# Patient Record
Sex: Male | Born: 1980 | Race: White | Hispanic: No | Marital: Married | Smoking: Current some day smoker
Health system: Southern US, Community
[De-identification: ages and names within clinical notes are randomized; demographics above are authoritative.]

## PROBLEM LIST (undated history)

## (undated) ENCOUNTER — Emergency Department (HOSPITAL_COMMUNITY): Admission: EM | Payer: BLUE CROSS/BLUE SHIELD | Source: Home / Self Care

## (undated) DIAGNOSIS — R0789 Other chest pain: Secondary | ICD-10-CM

## (undated) DIAGNOSIS — K219 Gastro-esophageal reflux disease without esophagitis: Secondary | ICD-10-CM

## (undated) HISTORY — DX: Gastro-esophageal reflux disease without esophagitis: K21.9

## (undated) HISTORY — DX: Other chest pain: R07.89

---
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
## 2010-04-03 ENCOUNTER — Ambulatory Visit: Payer: Self-pay | Admitting: Internal Medicine

## 2010-04-03 DIAGNOSIS — K219 Gastro-esophageal reflux disease without esophagitis: Secondary | ICD-10-CM | POA: Insufficient documentation

## 2010-04-03 DIAGNOSIS — Z87442 Personal history of urinary calculi: Secondary | ICD-10-CM | POA: Insufficient documentation

## 2010-04-03 DIAGNOSIS — F411 Generalized anxiety disorder: Secondary | ICD-10-CM | POA: Insufficient documentation

## 2010-07-29 NOTE — Assessment & Plan Note (Signed)
Summary: NEW PT/FLU SHOT/BCBS/#/LB   Vital Signs:  Patient profile:   30 year old male Height:      73 inches Weight:      212 pounds BMI:     28.07 O2 Sat:      97 % on Room air Temp:     98.9 degrees F oral Pulse rate:   83 / minute Pulse rhythm:   regular Resp:     16 per minute BP sitting:   120 / 82  (left arm) Cuff size:   regular  Vitals Entered By: Alysia Penna (April 03, 2010 1:54 PM)  Nutrition Counseling: Patient's BMI is greater than 25 and therefore counseled on weight management options.  O2 Flow:  Room air CC: pt here for flu vaccine. /cp sma   Primary Care Provider:  Yetta Barre  CC:  pt here for flu vaccine. /cp sma.  History of Present Illness: New to me he needs to establish with a new PCP. He feels well today and offers no complaints.  Dyspepsia History:      He has no alarm features of dyspepsia including no history of melena, hematochezia, dysphagia, persistent vomiting, or involuntary weight loss > 5%.  There is a prior history of GERD.  The patient does not have a prior history of documented ulcer disease.  The dominant symptom is heartburn or acid reflux.  An H-2 blocker medication is currently being taken.  He notes that the symptoms have improved with the H-2 blocker therapy.  Symptoms have not persisted after 4 weeks of H-2 blocker treatment.  No previous upper endoscopy has been done.    Preventive Screening-Counseling & Management  Alcohol-Tobacco     Alcohol drinks/day: <1     Alcohol type: all     >5/day in last 3 mos: no     Alcohol Counseling: not indicated; use of alcohol is not excessive or problematic     Feels need to cut down: no     Feels annoyed by complaints: no     Feels guilty re: drinking: no     Needs 'eye opener' in am: no     Smoking Status: never     Tobacco Counseling: not indicated; no tobacco use  Caffeine-Diet-Exercise     Does Patient Exercise: yes     Exercise Counseling: to improve exercise  regimen  Hep-HIV-STD-Contraception     Hepatitis Risk: no risk noted     HIV Risk: no risk noted     STD Risk: no risk noted     TSE monthly: yes     Testicular SE Education/Counseling to perform regular STE     Sun Exposure-Excessive: no  Safety-Violence-Falls     Seat Belt Use: yes     Helmet Use: n/a     Firearms in the Home: firearms in the home     Firearm Counseling: to practice firearm safety     Smoke Detectors: yes     Violence in the Home: no risk noted     Sexual Abuse: no      Sexual History:  currently monogamous.        Drug Use:  no.        Blood Transfusions:  no.    Medications Prior to Update: 1)  None  Current Medications (verified): 1)  Xanax 0.5 Mg Tabs (Alprazolam) .Marland Kitchen.. 1 Tablet Daily As Needed 2)  Zantac 75 75 Mg Tabs (Ranitidine Hcl) .... One By Mouth Two Times A Day  As Needed  Allergies (verified): No Known Drug Allergies  Past History:  Past Medical History: Anxiety- Dr. Evelene Croon GERD Nephrolithiasis, hx of  Past Surgical History: Denies surgical history  Family History: Family History Diabetes 1st degree relative Family History Hypertension  Social History: Occupation: IT for [employer] Married Never Smoked Alcohol use-yes Drug use-no Regular exercise-yes Smoking Status:  never Does Patient Exercise:  yes Hepatitis Risk:  no risk noted HIV Risk:  no risk noted STD Risk:  no risk noted Sun Exposure-Excessive:  no Seat Belt Use:  yes Sexual History:  currently monogamous Blood Transfusions:  no Drug Use:  no  Review of Systems       The patient complains of weight gain.  The patient denies anorexia, chest pain, syncope, dyspnea on exertion, peripheral edema, prolonged cough, headaches, hemoptysis, abdominal pain, hematuria, suspicious skin lesions, difficulty walking, depression, enlarged lymph nodes, angioedema, and testicular masses.   Psych:  Complains of anxiety; denies depression, easily angered, easily tearful,  irritability, mental problems, panic attacks, sense of great danger, suicidal thoughts/plans, thoughts of violence, unusual visions or sounds, and thoughts /plans of harming others.  Physical Exam  General:  alert, well-developed, well-nourished, well-hydrated, appropriate dress, normal appearance, healthy-appearing, cooperative to examination, good hygiene, and overweight-appearing.   Head:  normocephalic, atraumatic, no abnormalities observed, and no abnormalities palpated.   Eyes:  vision grossly intact, pupils equal, pupils round, and pupils reactive to light.   Mouth:  Oral mucosa and oropharynx without lesions or exudates.  Teeth in good repair. Neck:  supple, full ROM, no masses, no thyromegaly, no JVD, normal carotid upstroke, no carotid bruits, no cervical lymphadenopathy, and no neck tenderness.   Lungs:  normal respiratory effort, no intercostal retractions, no accessory muscle use, normal breath sounds, no dullness, no fremitus, no crackles, and no wheezes.   Heart:  normal rate, regular rhythm, no murmur, no gallop, no rub, and no JVD.   Abdomen:  soft, non-tender, normal bowel sounds, no distention, no masses, no guarding, no rigidity, no rebound tenderness, no abdominal hernia, no inguinal hernia, no hepatomegaly, and no splenomegaly.   Genitalia:  circumcised, no hydrocele, no varicocele, no scrotal masses, no testicular masses or atrophy, no cutaneous lesions, and no urethral discharge.   Msk:  No deformity or scoliosis noted of thoracic or lumbar spine.   Pulses:  R and L carotid,radial,femoral,dorsalis pedis and posterior tibial pulses are full and equal bilaterally Extremities:  No clubbing, cyanosis, edema, or deformity noted with normal full range of motion of all joints.   Neurologic:  No cranial nerve deficits noted. Station and gait are normal. Plantar reflexes are down-going bilaterally. DTRs are symmetrical throughout. Sensory, motor and coordinative functions appear  intact. Skin:  Intact without suspicious lesions or rashes Cervical Nodes:  no anterior cervical adenopathy and no posterior cervical adenopathy.   Axillary Nodes:  no R axillary adenopathy and no L axillary adenopathy.   Inguinal Nodes:  no R inguinal adenopathy and no L inguinal adenopathy.   Psych:  Cognition and judgment appear intact. Alert and cooperative with normal attention span and concentration. No apparent delusions, illusions, hallucinations   Impression & Recommendations:  Problem # 1:  GERD (ICD-530.81) Assessment Improved  His updated medication list for this problem includes:    Zantac 75 75 Mg Tabs (Ranitidine hcl) ..... One by mouth two times a day as needed  Complete Medication List: 1)  Xanax 0.5 Mg Tabs (Alprazolam) .Marland Kitchen.. 1 tablet daily as needed 2)  Zantac 75  75 Mg Tabs (Ranitidine hcl) .... One by mouth two times a day as needed  Other Orders: Admin 1st Vaccine (16109) Flu Vaccine 15yrs + (60454) Tdap => 33yrs IM (09811) Admin of Any Addtl Vaccine (91478)   Patient Instructions: 1)  Please schedule a follow-up appointment as needed. 2)  Avoid foods high in acid (tomatoes, citrus juices, spicy foods). Avoid eating within two hours of lying down or before exercising. Do not over eat; try smaller more frequent meals. Elevate head of bed twelve inches when sleeping. 3)  It is important that you exercise regularly at least 20 minutes 5 times a week. If you develop chest pain, have severe difficulty breathing, or feel very tired , stop exercising immediately and seek medical attention. 4)  You need to lose weight. Consider a lower calorie diet and regular exercise.  Flu Vaccine Consent Questions     Do you have a history of severe allergic reactions to this vaccine? no    Any prior history of allergic reactions to egg and/or gelatin? no    Do you have a sensitivity to the preservative Thimersol? no    Do you have a past history of Guillan-Barre Syndrome? no    Do  you currently have an acute febrile illness? no    Have you ever had a severe reaction to latex? no    Vaccine information given and explained to patient? yes    Are you currently pregnant? no    Lot Number:[id number]   Exp Date:12/27/2010   Site Given  Left Deltoid IM down or before exercising. Do not over eat; try smaller more frequent meals. Elevate head of bed twelve inches when sleeping. 3)  It is important that you exercise regularly at least 20 minutes 5 times a week. If you develop chest pain, have severe difficulty breathing, or feel very tired , stop exercising immediately and seek medical attention. 4)  You need to lose weight. Consider a lower calorie diet and regular exercise.   .lbflu   Immunizations Administered:  Tetanus Vaccine:    Vaccine Type: Tdap    Site: right deltoid    Mfr: GlaxoSmithKline    Dose: 0.5 ml    Route: IM    Given by: Rock Nephew CMA    Exp. Date: 03/19/2012    Lot #: [id number]    VIS given: 05/16/08 version given April 03, 2010.

## 2012-11-01 ENCOUNTER — Ambulatory Visit (INDEPENDENT_AMBULATORY_CARE_PROVIDER_SITE_OTHER): Payer: Self-pay | Admitting: Family Medicine

## 2012-11-01 VITALS — BP 112/70 | Temp 97.9°F | Wt 232.0 lb

## 2012-11-01 DIAGNOSIS — J399 Disease of upper respiratory tract, unspecified: Secondary | ICD-10-CM

## 2012-11-01 DIAGNOSIS — J398 Other specified diseases of upper respiratory tract: Secondary | ICD-10-CM

## 2012-11-01 MED ORDER — HYDROCODONE-HOMATROPINE 5-1.5 MG/5ML PO SYRP: 5.0000 mL | ORAL_SOLUTION | Freq: Two times a day (BID) | ORAL | Status: DC

## 2012-11-01 NOTE — Progress Notes (Signed)
Chief Complaint  Patient presents with  . Cough    congestion, sore throat, runny nose, chills, body aches x 2 days     HPI:  Acute visit for ?URI: -started: 2 days ago -symptoms:nasal congestion, sore throat, cough, chills, body aches -denies:fever, SOB, NVD, tooth pain,  Ear pain  -has tried: OTC cough meications -sick contacts: yes - son -Hx of: no history of asthma   ROS: See pertinent positives and negatives per HPI.  No past medical history on file.  No family history on file.  History   Social History  . Marital Status: Married    Spouse Name: N/A    Number of Children: N/A  . Years of Education: N/A   Social History Main Topics  . Smoking status: Not on file  . Smokeless tobacco: Not on file  . Alcohol Use: Not on file  . Drug Use: Not on file  . Sexually Active: Not on file   Other Topics Concern  . Not on file   Social History Narrative  . No narrative on file    Current outpatient prescriptions:HYDROcodone-homatropine (HYCODAN) 5-1.5 MG/5ML syrup, Take 5 mLs by mouth every 12 (twelve) hours., Disp: 120 mL, Rfl: 0  EXAM:  Filed Vitals:   11/01/12 1048  BP: 112/70  Temp: 97.9 F (36.6 C)    Body mass index is 30.62 kg/(m^2).  GENERAL: vitals reviewed and listed above, alert, oriented, appears well hydrated and in no acute distress  HEENT: atraumatic, conjunttiva clear, no obvious abnormalities on inspection of external nose and ears, normal appearance of ear canals and TMs, clear nasal congestion, mild post oropharyngeal erythema with PND, no tonsillar edema or exudate, no sinus TTP  NECK: no obvious masses on inspection  LUNGS: clear to auscultation bilaterally, no wheezes, rales or rhonchi, good air movement  CV: HRRR, no peripheral edema  MS: moves all extremities without noticeable abnormality  PSYCH: pleasant and cooperative, no obvious depression or anxiety  ASSESSMENT AND PLAN:  Discussed the following assessment and  plan:  Upper respiratory disease - Plan: HYDROcodone-homatropine (HYCODAN) 5-1.5 MG/5ML syrup  -VURI - advised supportive care and return precautions. Cough med given - risks discussed. -Patient advised to return or notify a doctor immediately if symptoms worsen or persist or new concerns arise.  Patient Instructions  INSTRUCTIONS FOR UPPER RESPIRATORY INFECTION:  -plenty of rest and fluids  -nasal saline wash 2-3 times daily (use prepackaged nasal saline or bottled/distilled water if making your own)   -can use sinex nasal spray for drainage and nasal congestion - but do NOT use longer then 3-4 days  -can use tylenol or ibuprofen as directed for aches and sorethroat  -in the winter time, using a humidifier at night is helpful (please follow cleaning instructions)  -if you are taking a cough medication - use only as directed, may also try a teaspoon of honey to coat the throat and throat lozenges  -for sore throat, salt water gargles can help  -follow up if you have fevers, facial pain, tooth pain, difficulty breathing or are worsening or not getting better as expected      KIM, Dahlia Client R.

## 2012-11-01 NOTE — Patient Instructions (Signed)
INSTRUCTIONS FOR UPPER RESPIRATORY INFECTION:  -plenty of rest and fluids  -nasal saline wash 2-3 times daily (use prepackaged nasal saline or bottled/distilled water if making your own)   -can use sinex nasal spray for drainage and nasal congestion - but do NOT use longer then 3-4 days  -can use tylenol or ibuprofen as directed for aches and sorethroat  -in the winter time, using a humidifier at night is helpful (please follow cleaning instructions)  -if you are taking a cough medication - use only as directed, may also try a teaspoon of honey to coat the throat and throat lozenges  -for sore throat, salt water gargles can help  -follow up if you have fevers, facial pain, tooth pain, difficulty breathing or are worsening or not getting better as expected

## 2012-11-14 ENCOUNTER — Encounter: Payer: Self-pay | Admitting: Internal Medicine

## 2012-12-05 ENCOUNTER — Ambulatory Visit (INDEPENDENT_AMBULATORY_CARE_PROVIDER_SITE_OTHER): Payer: BC Managed Care – PPO | Admitting: Internal Medicine

## 2012-12-05 ENCOUNTER — Encounter: Payer: Self-pay | Admitting: Internal Medicine

## 2012-12-05 ENCOUNTER — Other Ambulatory Visit (INDEPENDENT_AMBULATORY_CARE_PROVIDER_SITE_OTHER): Payer: BC Managed Care – PPO

## 2012-12-05 VITALS — BP 138/72 | HR 62 | Temp 98.0°F | Resp 16 | Wt 232.0 lb

## 2012-12-05 DIAGNOSIS — Z0000 Encounter for general adult medical examination without abnormal findings: Secondary | ICD-10-CM

## 2012-12-05 LAB — COMPREHENSIVE METABOLIC PANEL
Albumin: 4.2 g/dL (ref 3.5–5.2)
Alkaline Phosphatase: 75 U/L (ref 39–117)
BUN: 17 mg/dL (ref 6–23)
CO2: 30 mEq/L (ref 19–32)
Calcium: 9.5 mg/dL (ref 8.4–10.5)
Chloride: 106 mEq/L (ref 96–112)
GFR: 59.14 mL/min — ABNORMAL LOW (ref >60.00)
Glucose, Bld: 87 mg/dL (ref 70–99)
Potassium: 4.4 mEq/L (ref 3.5–5.1)

## 2012-12-05 LAB — CBC WITH DIFFERENTIAL/PLATELET
Basophils Absolute: 0 10*3/uL (ref 0.0–0.1)
Eosinophils Absolute: 0.1 10*3/uL (ref 0.0–0.7)
HCT: 46.7 % (ref 39.0–52.0)
Hemoglobin: 15.8 g/dL (ref 13.0–17.0)
Lymphs Abs: 2.3 10*3/uL (ref 0.7–4.0)
MCHC: 33.8 g/dL (ref 30.0–36.0)
MCV: 88.7 fl (ref 78.0–100.0)
Monocytes Absolute: 0.7 10*3/uL (ref 0.1–1.0)
Neutro Abs: 4.3 10*3/uL (ref 1.4–7.7)
Platelets: 205 10*3/uL (ref 150.0–400.0)
RDW: 13 % (ref 11.5–14.6)

## 2012-12-05 LAB — LIPID PANEL
Cholesterol: 177 mg/dL (ref 0–200)
VLDL: 126.8 mg/dL — ABNORMAL HIGH (ref 0.0–40.0)

## 2012-12-05 NOTE — Patient Instructions (Addendum)
Health Maintenance, Males A healthy lifestyle and preventative care can promote health and wellness.  Maintain regular health, dental, and eye exams.  Eat a healthy diet. Foods like vegetables, fruits, whole grains, low-fat dairy products, and lean protein foods contain the nutrients you need without too many calories. Decrease your intake of foods high in solid fats, added sugars, and salt. Get information about a proper diet from your caregiver, if necessary.  Regular physical exercise is one of the most important things you can do for your health. Most adults should get at least 150 minutes of moderate-intensity exercise (any activity that increases your heart rate and causes you to sweat) each week. In addition, most adults need muscle-strengthening exercises on 2 or more days a week.   Maintain a healthy weight. The body mass index (BMI) is a screening tool to identify possible weight problems. It provides an estimate of body fat based on height and weight. Your caregiver can help determine your BMI, and can help you achieve or maintain a healthy weight. For adults 20 years and older:  A BMI below 18.5 is considered underweight.  A BMI of 18.5 to 24.9 is normal.  A BMI of 25 to 29.9 is considered overweight.  A BMI of 30 and above is considered obese.  Maintain normal blood lipids and cholesterol by exercising and minimizing your intake of saturated fat. Eat a balanced diet with plenty of fruits and vegetables. Blood tests for lipids and cholesterol should begin at age 20 and be repeated every 5 years. If your lipid or cholesterol levels are high, you are over 50, or you are a high risk for heart disease, you may need your cholesterol levels checked more frequently.Ongoing high lipid and cholesterol levels should be treated with medicines, if diet and exercise are not effective.  If you smoke, find out from your caregiver how to quit. If you do not use tobacco, do not start.  If you  choose to drink alcohol, do not exceed 2 drinks per day. One drink is considered to be 12 ounces (355 mL) of beer, 5 ounces (148 mL) of wine, or 1.5 ounces (44 mL) of liquor.  Avoid use of street drugs. Do not share needles with anyone. Ask for help if you need support or instructions about stopping the use of drugs.  High blood pressure causes heart disease and increases the risk of stroke. Blood pressure should be checked at least every 1 to 2 years. Ongoing high blood pressure should be treated with medicines if weight loss and exercise are not effective.  If you are 45 to 34 years old, ask your caregiver if you should take aspirin to prevent heart disease.  Diabetes screening involves taking a blood sample to check your fasting blood sugar level. This should be done once every 3 years, after age 45, if you are within normal weight and without risk factors for diabetes. Testing should be considered at a younger age or be carried out more frequently if you are overweight and have at least 1 risk factor for diabetes.  Colorectal cancer can be detected and often prevented. Most routine colorectal cancer screening begins at the age of 50 and continues through age 75. However, your caregiver may recommend screening at an earlier age if you have risk factors for colon cancer. On a yearly basis, your caregiver may provide home test kits to check for hidden blood in the stool. Use of a small camera at the end of a tube,   to directly examine the colon (sigmoidoscopy or colonoscopy), can detect the earliest forms of colorectal cancer. Talk to your caregiver about this at age 50, when routine screening begins. Direct examination of the colon should be repeated every 5 to 10 years through age 75, unless early forms of pre-cancerous polyps or small growths are found.  Hepatitis C blood testing is recommended for all people born from 1945 through 1965 and any individual with known risks for hepatitis C.  Healthy  men should no longer receive prostate-specific antigen (PSA) blood tests as part of routine cancer screening. Consult with your caregiver about prostate cancer screening.  Testicular cancer screening is not recommended for adolescents or adult males who have no symptoms. Screening includes self-exam, caregiver exam, and other screening tests. Consult with your caregiver about any symptoms you have or any concerns you have about testicular cancer.  Practice safe sex. Use condoms and avoid high-risk sexual practices to reduce the spread of sexually transmitted infections (STIs).  Use sunscreen with a sun protection factor (SPF) of 30 or greater. Apply sunscreen liberally and repeatedly throughout the day. You should seek shade when your shadow is shorter than you. Protect yourself by wearing long sleeves, pants, a wide-brimmed hat, and sunglasses year round, whenever you are outdoors.  Notify your caregiver of new moles or changes in moles, especially if there is a change in shape or color. Also notify your caregiver if a mole is larger than the size of a pencil eraser.  A one-time screening for abdominal aortic aneurysm (AAA) and surgical repair of large AAAs by sound wave imaging (ultrasonography) is recommended for ages 65 to 75 years who are current or former smokers.  Stay current with your immunizations. Document Released: 12/12/2007 Document Revised: 09/07/2011 Document Reviewed: 11/10/2010 [employer] Patient Information 2014 [employer].  

## 2012-12-05 NOTE — Progress Notes (Signed)
  Subjective:    Patient ID: Wayne Carey, male    DOB: August 10, 1980, 32 y.o.   MRN: [id number]  HPI  He returns for a complete physical - he tells me that he feels well and offers no complaints.  Review of Systems  All other systems reviewed and are negative.       Objective:   Physical Exam  Vitals reviewed. Constitutional: He is oriented to person, place, and time. He appears well-developed and well-nourished. No distress.  HENT:  Head: Normocephalic and atraumatic.  Mouth/Throat: Oropharynx is clear and moist. No oropharyngeal exudate.  Eyes: Conjunctivae are normal. Right eye exhibits no discharge. Left eye exhibits no discharge. No scleral icterus.  Neck: Normal range of motion. Neck supple. No JVD present. No tracheal deviation present. No thyromegaly present.  Cardiovascular: Normal rate, regular rhythm, normal heart sounds and intact distal pulses.  Exam reveals no gallop and no friction rub.   No murmur heard. Pulmonary/Chest: Effort normal and breath sounds normal. No stridor. No respiratory distress. He has no wheezes. He has no rales. He exhibits no tenderness.  Abdominal: Soft. Bowel sounds are normal. He exhibits no distension and no mass. There is no tenderness. There is no rebound and no guarding.  Genitourinary: Testes normal and penis normal. Left testis shows no mass, no swelling and no tenderness. Left testis is descended. Uncircumcised. No phimosis, paraphimosis, hypospadias, penile erythema or penile tenderness. No discharge found.  Right hemiscrotum is empty  Musculoskeletal: Normal range of motion. He exhibits no edema and no tenderness.  Lymphadenopathy:    He has no cervical adenopathy.  Neurological: He is oriented to person, place, and time.  Skin: Skin is warm and dry. No rash noted. He is not diaphoretic. No erythema. No pallor.  Psychiatric: He has a normal mood and affect. His behavior is normal. Judgment and thought content normal.           Assessment & Plan:

## 2012-12-05 NOTE — Assessment & Plan Note (Signed)
Exam done Vaccines were reviewed Labs ordered Pt ed material was given 

## 2013-05-04 ENCOUNTER — Other Ambulatory Visit: Payer: Self-pay

## 2014-03-13 ENCOUNTER — Encounter: Payer: Self-pay | Admitting: Internal Medicine

## 2014-03-13 ENCOUNTER — Ambulatory Visit (INDEPENDENT_AMBULATORY_CARE_PROVIDER_SITE_OTHER): Payer: BC Managed Care – PPO | Admitting: Internal Medicine

## 2014-03-13 VITALS — BP 138/84 | HR 85 | Temp 98.4°F | Resp 16 | Ht 73.0 in | Wt 238.0 lb

## 2014-03-13 DIAGNOSIS — K649 Unspecified hemorrhoids: Secondary | ICD-10-CM | POA: Insufficient documentation

## 2014-03-13 DIAGNOSIS — K219 Gastro-esophageal reflux disease without esophagitis: Secondary | ICD-10-CM

## 2014-03-13 MED ORDER — HYDROCORTISONE ACE-PRAMOXINE 1-1 % RE FOAM: 1.0000 | Freq: Two times a day (BID) | RECTAL | Status: DC

## 2014-03-13 NOTE — Progress Notes (Signed)
Pre visit review using our clinic review tool, if applicable. No additional management support is needed unless otherwise documented below in the visit note. 

## 2014-03-13 NOTE — Assessment & Plan Note (Signed)
Will treat the discomfort with proctofoam I am concerned that he may have a fissure so I have asked him to see general surgery for further evaluation

## 2014-03-13 NOTE — Patient Instructions (Signed)

## 2014-03-13 NOTE — Progress Notes (Signed)
Subjective:    Patient ID: Wayne Carey, male    DOB: 01/05/1981, 33 y.o.   MRN: [id number]  HPI Comments: He complains of a one year history of rectal pain, itching, burning, occasional bleeding.     Review of Systems  Constitutional: Negative.  Negative for fever, chills, diaphoresis, appetite change and fatigue.  HENT: Negative.   Eyes: Negative.   Respiratory: Negative.  Negative for cough, choking, chest tightness, shortness of breath and stridor.   Cardiovascular: Negative.  Negative for chest pain, palpitations and leg swelling.  Gastrointestinal: Positive for blood in stool, anal bleeding and rectal pain. Negative for nausea, vomiting, abdominal pain, diarrhea, constipation and abdominal distention.  Endocrine: Negative.   Genitourinary: Negative.  Negative for dysuria, urgency, frequency, hematuria, flank pain, decreased urine volume, discharge, penile swelling, scrotal swelling, enuresis, difficulty urinating, genital sores, penile pain and testicular pain.  Musculoskeletal: Negative.  Negative for arthralgias, back pain, joint swelling and myalgias.  Skin: Negative.  Negative for rash.  Allergic/Immunologic: Negative.   Neurological: Negative.   Hematological: Negative.  Negative for adenopathy. Does not bruise/bleed easily.  Psychiatric/Behavioral: Negative.        Objective:   Physical Exam  Vitals reviewed. Constitutional: He is oriented to person, place, and time. He appears well-developed and well-nourished. No distress.  HENT:  Head: Normocephalic and atraumatic.  Mouth/Throat: Oropharynx is clear and moist. No oropharyngeal exudate.  Eyes: Conjunctivae are normal. Right eye exhibits no discharge. Left eye exhibits no discharge. No scleral icterus.  Neck: Normal range of motion. Neck supple. No JVD present. No tracheal deviation present. No thyromegaly present.  Cardiovascular: Normal rate, regular rhythm, normal heart sounds and intact distal pulses.  Exam  reveals no gallop and no friction rub.   No murmur heard. Pulmonary/Chest: Effort normal and breath sounds normal. No stridor. No respiratory distress. He has no wheezes. He has no rales. He exhibits no tenderness.  Abdominal: Soft. Bowel sounds are normal. He exhibits no distension and no mass. There is no tenderness. There is no rebound and no guarding. Hernia confirmed negative in the right inguinal area and confirmed negative in the left inguinal area.  Genitourinary: Prostate normal, testes normal and penis normal. Rectal exam shows external hemorrhoid, internal hemorrhoid, fissure and tenderness. Rectal exam shows no mass and anal tone normal. Guaiac positive stool. Prostate is not enlarged and not tender. Right testis shows no mass, no swelling and no tenderness. Right testis is descended. Left testis shows no mass, no swelling and no tenderness. Left testis is descended. Circumcised. No penile erythema or penile tenderness. No discharge found.     Musculoskeletal: Normal range of motion. He exhibits no edema and no tenderness.  Lymphadenopathy:    He has no cervical adenopathy.       Right: No inguinal adenopathy present.       Left: No inguinal adenopathy present.  Neurological: He is oriented to person, place, and time.  Skin: Skin is warm and dry. No rash noted. He is not diaphoretic. No erythema. No pallor.     Lab Results  Component Value Date   WBC 7.4 12/05/2012   HGB 15.8 12/05/2012   HCT 46.7 12/05/2012   PLT 205.0 12/05/2012   GLUCOSE 87 12/05/2012   CHOL 177 12/05/2012   TRIG 634.0* 12/05/2012   HDL 23.20* 12/05/2012   LDLDIRECT 73.8 12/05/2012   ALT 94* 12/05/2012   AST 38* 12/05/2012   NA 141 12/05/2012   K 4.4 12/05/2012   CL  106 12/05/2012   CREATININE 1.5 12/05/2012   BUN 17 12/05/2012   CO2 30 12/05/2012   TSH 2.46 12/05/2012  .     Assessment & Plan:

## 2014-04-13 ENCOUNTER — Ambulatory Visit: Payer: BC Managed Care – PPO | Admitting: Internal Medicine

## 2014-07-10 ENCOUNTER — Ambulatory Visit (INDEPENDENT_AMBULATORY_CARE_PROVIDER_SITE_OTHER): Payer: BLUE CROSS/BLUE SHIELD | Admitting: Internal Medicine

## 2014-07-10 ENCOUNTER — Encounter: Payer: Self-pay | Admitting: Internal Medicine

## 2014-07-10 ENCOUNTER — Other Ambulatory Visit (INDEPENDENT_AMBULATORY_CARE_PROVIDER_SITE_OTHER): Payer: BLUE CROSS/BLUE SHIELD

## 2014-07-10 VITALS — BP 128/82 | HR 85 | Temp 98.3°F | Resp 16 | Ht 73.0 in | Wt 233.5 lb

## 2014-07-10 DIAGNOSIS — Q5301 Ectopic testis, unilateral: Secondary | ICD-10-CM

## 2014-07-10 DIAGNOSIS — R109 Unspecified abdominal pain: Secondary | ICD-10-CM

## 2014-07-10 DIAGNOSIS — J0121 Acute recurrent ethmoidal sinusitis: Secondary | ICD-10-CM | POA: Insufficient documentation

## 2014-07-10 LAB — COMPREHENSIVE METABOLIC PANEL
ALBUMIN: 4.4 g/dL (ref 3.5–5.2)
ALT: 54 U/L — ABNORMAL HIGH (ref 0–53)
AST: 30 U/L (ref 0–37)
Alkaline Phosphatase: 63 U/L (ref 39–117)
BUN: 16 mg/dL (ref 6–23)
CALCIUM: 9.1 mg/dL (ref 8.4–10.5)
CO2: 28 mEq/L (ref 19–32)
Chloride: 107 mEq/L (ref 96–112)
Creatinine, Ser: 1.5 mg/dL (ref 0.4–1.5)
GFR: 56.79 mL/min — ABNORMAL LOW (ref >60.00)
GLUCOSE: 117 mg/dL — AB (ref 70–99)
POTASSIUM: 4.6 meq/L (ref 3.5–5.1)
SODIUM: 141 meq/L (ref 135–145)
TOTAL PROTEIN: 7.7 g/dL (ref 6.0–8.3)
Total Bilirubin: 0.8 mg/dL (ref 0.2–1.2)

## 2014-07-10 LAB — CBC WITH DIFFERENTIAL/PLATELET
BASOS ABS: 0 10*3/uL (ref 0.0–0.1)
Basophils Relative: 0.3 % (ref 0.0–3.0)
EOS ABS: 0.1 10*3/uL (ref 0.0–0.7)
EOS PCT: 0.6 % (ref 0.0–5.0)
HCT: 47.8 % (ref 39.0–52.0)
HEMOGLOBIN: 15.9 g/dL (ref 13.0–17.0)
LYMPHS ABS: 2 10*3/uL (ref 0.7–4.0)
Lymphocytes Relative: 20.6 % (ref 12.0–46.0)
MCHC: 33.2 g/dL (ref 30.0–36.0)
MCV: 87.1 fl (ref 78.0–100.0)
MONO ABS: 0.7 10*3/uL (ref 0.1–1.0)
Monocytes Relative: 7.2 % (ref 3.0–12.0)
NEUTROS PCT: 71.3 % (ref 43.0–77.0)
Neutro Abs: 7 10*3/uL (ref 1.4–7.7)
Platelets: 255 10*3/uL (ref 150.0–400.0)
RBC: 5.5 Mil/uL (ref 4.22–5.81)
RDW: 12.5 % (ref 11.5–15.5)
WBC: 9.8 10*3/uL (ref 4.0–10.5)

## 2014-07-10 LAB — URINALYSIS, ROUTINE W REFLEX MICROSCOPIC
Bilirubin Urine: NEGATIVE
Ketones, ur: NEGATIVE
Leukocytes, UA: NEGATIVE
NITRITE: NEGATIVE
Specific Gravity, Urine: >=1.03 — AB (ref 1.000–1.030)
Total Protein, Urine: NEGATIVE
Urine Glucose: NEGATIVE
Urobilinogen, UA: 0.2 (ref 0.0–1.0)
pH: 5.5 (ref 5.0–8.0)

## 2014-07-10 LAB — LIPASE: LIPASE: 36 U/L (ref 11.0–59.0)

## 2014-07-10 LAB — AMYLASE: AMYLASE: 68 U/L (ref 27–131)

## 2014-07-10 MED ORDER — CEFUROXIME AXETIL 500 MG PO TABS: 500.0000 mg | ORAL_TABLET | Freq: Two times a day (BID) | ORAL | Status: DC

## 2014-07-10 NOTE — Assessment & Plan Note (Signed)
Will treat the infection with ceftin 

## 2014-07-10 NOTE — Assessment & Plan Note (Signed)
UA is + for tr RBC, will get a CT scan done to if there is a stone The other labs are normal

## 2014-07-10 NOTE — Progress Notes (Signed)
Pre visit review using our clinic review tool, if applicable. No additional management support is needed unless otherwise documented below in the visit note. 

## 2014-07-10 NOTE — Assessment & Plan Note (Signed)
Will get a CT scan done to see if the testicle can be found and to look for mass or cancerous changes

## 2014-07-10 NOTE — Progress Notes (Signed)
Subjective:    Patient ID: Wayne Carey, male    DOB: 1980-11-30, 34 y.o.   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 He also reports right flank pain intermittently for several days.  Sinusitis This is a new problem. The current episode started in the past 7 days. The problem has been gradually worsening since onset. There has been no fever. The fever has been present for less than 1 day. His pain is at a severity of 0/10. He is experiencing no pain. Associated symptoms include chills, congestion, sinus pressure and a sore throat. Pertinent negatives include no coughing, diaphoresis, ear pain, headaches, hoarse voice, neck pain, shortness of breath, sneezing or swollen glands. Past treatments include nothing.  Flank Pain This is a recurrent problem. The current episode started in the past 7 days. The problem occurs intermittently. The problem is unchanged. The quality of the pain is described as aching. The pain does not radiate. The pain is at a severity of 2/10. The pain is mild. The pain is worse during the day. Associated symptoms include abdominal pain. Pertinent negatives include no bladder incontinence, bowel incontinence, chest pain, dysuria, fever, headaches, leg pain, numbness, paresis, paresthesias, pelvic pain, perianal numbness, tingling, weakness or weight loss. He has tried nothing for the symptoms.      Review of Systems  Constitutional: Positive for chills. Negative for fever, weight loss, diaphoresis, activity change, appetite change and fatigue.  HENT: Positive for congestion, sinus pressure and sore throat. Negative for ear pain, hoarse voice and sneezing.   Eyes: Negative.   Respiratory: Negative.  Negative for cough, choking, chest tightness and shortness of breath.   Cardiovascular: Negative.  Negative for chest pain, palpitations and  leg swelling.  Gastrointestinal: Positive for abdominal pain. Negative for nausea, vomiting, diarrhea, constipation, blood in stool, abdominal distention, anal bleeding, rectal pain and bowel incontinence.  Endocrine: Negative.   Genitourinary: Positive for flank pain. Negative for bladder incontinence, dysuria, frequency, hematuria, discharge, penile swelling, scrotal swelling, difficulty urinating, testicular pain and pelvic pain.  Musculoskeletal: Negative.  Negative for neck pain.  Skin: Negative.   Allergic/Immunologic: Negative.   Neurological: Negative.  Negative for tingling, weakness, numbness, headaches and paresthesias.  Hematological: Negative.  Negative for adenopathy. Does not bruise/bleed easily.  Psychiatric/Behavioral: Negative.        Objective:   Physical Exam  Constitutional: He is oriented to person, place, and time. He appears well-developed and well-nourished.  Non-toxic appearance. He does not have a sickly appearance. He does not appear ill. No distress.  HENT:  Head: Normocephalic and atraumatic.  Right Ear: Hearing, tympanic membrane, external ear and ear canal normal.  Left Ear: Hearing, tympanic membrane, external ear and ear canal normal.  Nose: No mucosal edema, rhinorrhea or nasal septal hematoma. No epistaxis. Right sinus exhibits maxillary sinus tenderness. Right sinus exhibits no frontal sinus tenderness. Left sinus exhibits no maxillary sinus tenderness and no frontal sinus tenderness.  Mouth/Throat: Oropharynx is clear and moist and mucous membranes are normal. Mucous membranes are not pale, not dry and not cyanotic. No oral lesions. No trismus in the jaw. No uvula swelling. No oropharyngeal exudate, posterior oropharyngeal edema, posterior oropharyngeal erythema or tonsillar abscesses.  Eyes: Conjunctivae are normal. Right eye exhibits no discharge. Left eye exhibits no discharge. No scleral icterus.  Neck: Normal range of motion. Neck supple. No JVD  present. No tracheal deviation present. No thyromegaly present.  Cardiovascular: Normal rate, regular rhythm, normal heart sounds and intact distal pulses.  Exam reveals no gallop and no friction rub.   No murmur heard. Pulmonary/Chest: Effort normal and breath sounds normal. No stridor. No respiratory distress. He has no wheezes. He has no rales. He exhibits no tenderness.  Abdominal: Soft. Normal appearance and bowel sounds are normal. He exhibits no distension and no mass. There is no hepatosplenomegaly, splenomegaly or hepatomegaly. There is tenderness in the right upper quadrant, right lower quadrant and left lower quadrant. There is no rebound, no guarding, no CVA tenderness, no tenderness at McBurney's point and negative Murphy's sign. No hernia. Hernia confirmed negative in the ventral area, confirmed negative in the right inguinal area and confirmed negative in the left inguinal area.  Genitourinary: Penis normal. Right testis shows no mass, no swelling and no tenderness. Right testis is descended. Left testis shows no mass, no swelling and no tenderness. Left testis is undescended. Uncircumcised. No phimosis, paraphimosis, hypospadias, penile erythema or penile tenderness. No discharge found.  Musculoskeletal: Normal range of motion. He exhibits no edema or tenderness.  Lymphadenopathy:    He has no cervical adenopathy.       Right: No inguinal adenopathy present.       Left: No inguinal adenopathy present.  Neurological: He is oriented to person, place, and time.  Skin: Skin is warm and dry. No rash noted. He is not diaphoretic. No erythema. No pallor.  Vitals reviewed.   Lab Results  Component Value Date   WBC 9.8 07/10/2014   HGB 15.9 07/10/2014   HCT 47.8 07/10/2014   PLT 255.0 07/10/2014   GLUCOSE 117* 07/10/2014   CHOL 177 12/05/2012   TRIG 634.0* 12/05/2012   HDL 23.20* 12/05/2012   LDLDIRECT 73.8 12/05/2012   ALT 54* 07/10/2014   AST 30 07/10/2014   NA 141 07/10/2014    K 4.6 07/10/2014   CL 107 07/10/2014   CREATININE 1.5 07/10/2014   BUN 16 07/10/2014   CO2 28 07/10/2014   TSH 2.46 12/05/2012        Assessment & Plan:

## 2014-07-10 NOTE — Patient Instructions (Signed)
Flank Pain °Flank pain refers to pain that is located on the side of the body between the upper abdomen and the back. The pain may occur over a short period of time (acute) or may be long-term or reoccurring (chronic). It may be mild or severe. Flank pain can be caused by many things. °CAUSES  °Some of the more common causes of flank pain include: °· Muscle strains.   °· Muscle spasms.   °· A disease of your spine (vertebral disk disease).   °· A lung infection (pneumonia).   °· Fluid around your lungs (pulmonary edema).   °· A kidney infection.   °· Kidney stones.   °· A very painful skin rash caused by the chickenpox virus (shingles).   °· Gallbladder disease.   °HOME CARE INSTRUCTIONS  °Home care will depend on the cause of your pain. In general, °· Rest as directed by your caregiver. °· Drink enough fluids to keep your urine clear or pale yellow. °· Only take over-the-counter or prescription medicines as directed by your caregiver. Some medicines may help relieve the pain. °· Tell your caregiver about any changes in your pain. °· Follow up with your caregiver as directed. °SEEK IMMEDIATE MEDICAL CARE IF:  °· Your pain is not controlled with medicine.   °· You have new or worsening symptoms. °· Your pain increases.   °· You have abdominal pain.   °· You have shortness of breath.   °· You have persistent nausea or vomiting.   °· You have swelling in your abdomen.   °· You feel faint or pass out.   °· You have blood in your urine. °· You have a fever or persistent symptoms for more than 2-3 days. °· You have a fever and your symptoms suddenly get worse. °MAKE SURE YOU:  °· Understand these instructions. °· Will watch your condition. °· Will get help right away if you are not doing well or get worse. °Document Released: 08/06/2005 Document Revised: 03/09/2012 Document Reviewed: 01/28/2012 °[employer]® Patient Information ©2015 [employer]. This information is not intended to replace advice given to you by your  health care provider. Make sure you discuss any questions you have with your health care provider. ° °

## 2014-07-20 ENCOUNTER — Inpatient Hospital Stay: Admission: RE | Admit: 2014-07-20 | Payer: BLUE CROSS/BLUE SHIELD | Source: Ambulatory Visit

## 2014-07-25 ENCOUNTER — Encounter: Payer: Self-pay | Admitting: Internal Medicine

## 2014-07-25 ENCOUNTER — Ambulatory Visit (INDEPENDENT_AMBULATORY_CARE_PROVIDER_SITE_OTHER)
Admission: RE | Admit: 2014-07-25 | Discharge: 2014-07-25 | Disposition: A | Discharge status: Home / Self Care | Payer: BLUE CROSS/BLUE SHIELD | Source: Ambulatory Visit | Attending: Internal Medicine | Admitting: Internal Medicine

## 2014-07-25 DIAGNOSIS — Q5301 Ectopic testis, unilateral: Secondary | ICD-10-CM

## 2014-07-25 DIAGNOSIS — R109 Unspecified abdominal pain: Secondary | ICD-10-CM

## 2014-07-25 MED ORDER — IOHEXOL 300 MG/ML  SOLN
100.0000 mL | Freq: Once | INTRAMUSCULAR | Status: AC | PRN
  Administered 2014-07-25: 100 mL via INTRAVENOUS

## 2014-07-26 ENCOUNTER — Telehealth: Payer: Self-pay | Admitting: Internal Medicine

## 2014-07-26 NOTE — Telephone Encounter (Signed)
emmi emailed °

## 2014-07-26 NOTE — Telephone Encounter (Signed)
Patient called stating he received his CT results and would like to know what the next course of treatment should be. He is also wanting to know why there is blood in his urine. CB# [phone]

## 2014-08-03 ENCOUNTER — Encounter: Payer: Self-pay | Admitting: Internal Medicine

## 2014-08-03 ENCOUNTER — Other Ambulatory Visit (INDEPENDENT_AMBULATORY_CARE_PROVIDER_SITE_OTHER): Payer: BLUE CROSS/BLUE SHIELD

## 2014-08-03 ENCOUNTER — Ambulatory Visit (INDEPENDENT_AMBULATORY_CARE_PROVIDER_SITE_OTHER): Payer: BLUE CROSS/BLUE SHIELD | Admitting: Internal Medicine

## 2014-08-03 VITALS — BP 128/88 | HR 98 | Temp 98.5°F | Ht 73.0 in | Wt 226.5 lb

## 2014-08-03 DIAGNOSIS — Q5301 Ectopic testis, unilateral: Secondary | ICD-10-CM

## 2014-08-03 DIAGNOSIS — E781 Pure hyperglyceridemia: Secondary | ICD-10-CM

## 2014-08-03 DIAGNOSIS — K760 Fatty (change of) liver, not elsewhere classified: Secondary | ICD-10-CM

## 2014-08-03 LAB — COMPREHENSIVE METABOLIC PANEL
ALK PHOS: 65 U/L (ref 39–117)
ALT: 53 U/L (ref 0–53)
AST: 27 U/L (ref 0–37)
Albumin: 4.7 g/dL (ref 3.5–5.2)
BUN: 17 mg/dL (ref 6–23)
CO2: 27 meq/L (ref 19–32)
Calcium: 9.6 mg/dL (ref 8.4–10.5)
Chloride: 105 mEq/L (ref 96–112)
Creatinine, Ser: 1.59 mg/dL — ABNORMAL HIGH (ref 0.40–1.50)
GFR: 52.66 mL/min — ABNORMAL LOW (ref >60.00)
Glucose, Bld: 88 mg/dL (ref 70–99)
POTASSIUM: 3.9 meq/L (ref 3.5–5.1)
Sodium: 140 mEq/L (ref 135–145)
TOTAL PROTEIN: 8.3 g/dL (ref 6.0–8.3)
Total Bilirubin: 1 mg/dL (ref 0.2–1.2)

## 2014-08-03 LAB — LIPID PANEL
CHOL/HDL RATIO: 6
CHOLESTEROL: 136 mg/dL (ref 0–200)
HDL: 24.6 mg/dL — ABNORMAL LOW (ref >39.00)
NONHDL: 111.4
Triglycerides: 317 mg/dL — ABNORMAL HIGH (ref 0.0–149.0)
VLDL: 63.4 mg/dL — AB (ref 0.0–40.0)

## 2014-08-03 LAB — LDL CHOLESTEROL, DIRECT: Direct LDL: 74 mg/dL

## 2014-08-03 MED ORDER — ICOSAPENT ETHYL 1 G PO CAPS: 2.0000 | ORAL_CAPSULE | Freq: Two times a day (BID) | ORAL | Status: AC

## 2014-08-03 NOTE — Progress Notes (Signed)
   Subjective:    Patient ID: Wayne Carey, male    DOB: 12-04-1980, 34 y.o.   MRN: [id number]  Hyperlipidemia This is a recurrent problem. The current episode started more than 1 year ago. The problem is uncontrolled. Recent lipid tests were reviewed and are high. Exacerbating diseases include obesity. He has no history of chronic renal disease, diabetes, hypothyroidism, liver disease or nephrotic syndrome. Factors aggravating his hyperlipidemia include fatty foods. Pertinent negatives include no chest pain, focal sensory loss, focal weakness, leg pain, myalgias or shortness of breath. He is currently on no antihyperlipidemic treatment. Compliance problems include adherence to diet and adherence to exercise.       Review of Systems  Constitutional: Negative.  Negative for fever, chills, diaphoresis, appetite change and fatigue.  HENT: Negative.   Eyes: Negative.   Respiratory: Negative.  Negative for shortness of breath.   Cardiovascular: Negative.  Negative for chest pain, palpitations and leg swelling.  Gastrointestinal: Negative.  Negative for nausea, vomiting, abdominal pain, diarrhea and constipation.  Endocrine: Negative.   Genitourinary: Negative.  Negative for dysuria, urgency, hematuria, flank pain and decreased urine volume.       He complains of persistent pain in his left groin  Musculoskeletal: Negative.  Negative for myalgias.  Skin: Negative.   Allergic/Immunologic: Negative.   Neurological: Negative.  Negative for focal weakness.  Hematological: Negative.  Negative for adenopathy. Does not bruise/bleed easily.  Psychiatric/Behavioral: Negative.        Objective:   Physical Exam  Constitutional: He is oriented to person, place, and time. He appears well-developed and well-nourished. No distress.  HENT:  Head: Normocephalic and atraumatic.  Mouth/Throat: Oropharynx is clear and moist. No oropharyngeal exudate.  Eyes: Conjunctivae are normal. Right eye exhibits no  discharge. Left eye exhibits no discharge. No scleral icterus.  Neck: Normal range of motion. Neck supple. No JVD present. No tracheal deviation present. No thyromegaly present.  Cardiovascular: Normal rate, regular rhythm, normal heart sounds and intact distal pulses.  Exam reveals no gallop and no friction rub.   No murmur heard. Pulmonary/Chest: Effort normal and breath sounds normal. No stridor. No respiratory distress. He has no wheezes. He has no rales. He exhibits no tenderness.  Abdominal: Soft. Bowel sounds are normal. He exhibits no distension and no mass. There is no tenderness. There is no rebound and no guarding.  Musculoskeletal: Normal range of motion. He exhibits no edema or tenderness.  Lymphadenopathy:    He has no cervical adenopathy.  Neurological: He is oriented to person, place, and time.  Skin: Skin is warm and dry. No rash noted. He is not diaphoretic. No erythema. No pallor.  Vitals reviewed.    Lab Results  Component Value Date   WBC 9.8 07/10/2014   HGB 15.9 07/10/2014   HCT 47.8 07/10/2014   PLT 255.0 07/10/2014   GLUCOSE 117* 07/10/2014   CHOL 177 12/05/2012   TRIG 634.0* 12/05/2012   HDL 23.20* 12/05/2012   LDLDIRECT 73.8 12/05/2012   ALT 54* 07/10/2014   AST 30 07/10/2014   NA 141 07/10/2014   K 4.6 07/10/2014   CL 107 07/10/2014   CREATININE 1.5 07/10/2014   BUN 16 07/10/2014   CO2 28 07/10/2014   TSH 2.46 12/05/2012       Assessment & Plan:

## 2014-08-03 NOTE — Assessment & Plan Note (Signed)
He has persistent left groin pain, the CT was unremarkable I have asked him to see urology for further evlauation

## 2014-08-03 NOTE — Patient Instructions (Signed)

## 2014-08-03 NOTE — Progress Notes (Signed)
Pre visit review using our clinic review tool, if applicable. No additional management support is needed unless otherwise documented below in the visit note. 

## 2014-08-05 NOTE — Assessment & Plan Note (Signed)
He is committed to working on his lifestyle modifications

## 2014-08-05 NOTE — Assessment & Plan Note (Signed)
He recently had some abd pain (? Mild pancreatitis) but that has resolved Will start omega-3-oils and he will work on his lifestyle modifications

## 2014-08-31 ENCOUNTER — Ambulatory Visit (INDEPENDENT_AMBULATORY_CARE_PROVIDER_SITE_OTHER): Payer: BLUE CROSS/BLUE SHIELD | Admitting: Internal Medicine

## 2014-08-31 ENCOUNTER — Encounter: Payer: Self-pay | Admitting: Internal Medicine

## 2014-08-31 VITALS — BP 126/90 | HR 66 | Temp 97.6°F | Ht 73.0 in | Wt 226.5 lb

## 2014-08-31 DIAGNOSIS — M542 Cervicalgia: Secondary | ICD-10-CM

## 2014-08-31 DIAGNOSIS — J310 Chronic rhinitis: Secondary | ICD-10-CM

## 2014-08-31 NOTE — Patient Instructions (Signed)
Use an anti-inflammatory cream such as Aspercreme or Zostrix cream twice a day to the affected area as needed. In lieu of this warm moist compresses or  hot water bottle can be used. Do not apply ice .  Plain Mucinex (NOT D) for thick secretions ;force NON dairy fluids .   Nasal cleansing in the shower as discussed with lather of mild shampoo.After 10 seconds wash off lather while  exhaling through nostrils. Make sure that all residual soap is removed to prevent irritation.  Flonase OR Nasacort AQ 1 spray in each nostril twice a day as needed. Use the "crossover" technique into opposite nostril spraying toward opposite ear @ 45 degree angle, not straight up into nostril.  Plain Allegra (NOT D )  160 daily , Loratidine 10 mg , OR Zyrtec 10 mg @ bedtime  as needed for itchy eyes & sneezing. Zicam Melts or Zinc lozenges as per package label for sore throat .  Report fever; discolored nasal or chest secretions; or frontal headache or facial pain  present

## 2014-08-31 NOTE — Progress Notes (Signed)
Pre visit review using our clinic review tool, if applicable. No additional management support is needed unless otherwise documented below in the visit note. 

## 2014-08-31 NOTE — Progress Notes (Signed)
   Subjective:    Patient ID: Wayne Carey, male    DOB: Nov 06, 1980, 34 y.o.   MRN: [id number]  HPI Symptoms began 3-4 weeks ago as soreness @ the left anterior neck described as dull up to 2 on 10 scale & intermittent. No trigger or injury prior to onset of symtoms.. Occasionally it hurts to swallow. It can radiate to the left laryngeal area and behind the left ear. He remembers no injury or trigger except for coughing related to recent respiratory  tract infection.He has a nonproductive cough typically twice a day. He describes some head congestion particularly in the Winter. His  36-year-old has been ill. His wife had strep.   He has not smoked for 3 months. He has a past history of reflux which is controlled with Prilosec.  He denies any other symptoms of an upper respiratory tract infection or of extrinsic symptoms.   Review of Systems Frontal headache, facial pain , nasal purulence, dental pain, or otic discharge denied. No fever , chills or sweats. Extrinsic symptoms of itchy, watery eyes, sneezing, or angioedema are denied. There is no sputum production, wheezing,or  paroxysmal nocturnal dyspnea.      Objective:   Physical Exam   He has pattern alopecia.  He has a beard and mustache.  There is erythema & drying  of the nares without exudate. The left sternocleidomastoid muscle is slightly  tender. He has no TMJ tenderness.  General appearance:Adequately nourished; no acute distress or increased work of breathing is present.  No  lymphadenopathy about the head, neck, or axilla noted.  Eyes: No conjunctival inflammation or lid edema is present. There is no scleral icterus. Ears:  External ear exam shows no significant lesions or deformities.  Otoscopic examination reveals clear canals, tympanic membranes are intact bilaterally without bulging, retraction, inflammation or discharge. Nose:  External nasal examination shows no deformity or inflammation.  No septal dislocation or  deviation.No obstruction to airflow.  Oral exam: Dental hygiene is good; lips and gums are healthy appearing.There is no oropharyngeal erythema or exudate noted.  Neck:  No deformities, thyromegaly, masses, or tenderness noted.   Supple with full range of motion without pain.  Heart:  Normal rate and regular rhythm. S1 and S2 normal without gallop, murmur, click, rub or other extra sounds.  Lungs:Chest clear to auscultation; no wheezes, rhonchi,rales ,or rubs present. Extremities:  No cyanosis, edema, or clubbing  noted  Skin: Warm & dry w/o jaundice or tenting.      Assessment & Plan:  #1 nonallergic rhinitis with sore throat   #2 some sternocleidomastoid tenderness ; no history of injury  Plan: See orders and recommendations

## 2015-04-24 ENCOUNTER — Emergency Department (HOSPITAL_COMMUNITY): Payer: BLUE CROSS/BLUE SHIELD

## 2015-04-24 ENCOUNTER — Encounter (HOSPITAL_COMMUNITY): Payer: Self-pay | Admitting: Emergency Medicine

## 2015-04-24 ENCOUNTER — Emergency Department (HOSPITAL_COMMUNITY)
Admission: EM | Admit: 2015-04-24 | Discharge: 2015-04-24 | Disposition: A | Discharge status: Home / Self Care | Payer: BLUE CROSS/BLUE SHIELD | Attending: Emergency Medicine | Admitting: Emergency Medicine

## 2015-04-24 ENCOUNTER — Telehealth: Payer: Self-pay | Admitting: Internal Medicine

## 2015-04-24 DIAGNOSIS — R072 Precordial pain: Secondary | ICD-10-CM | POA: Insufficient documentation

## 2015-04-24 DIAGNOSIS — K219 Gastro-esophageal reflux disease without esophagitis: Secondary | ICD-10-CM | POA: Insufficient documentation

## 2015-04-24 DIAGNOSIS — Z79899 Other long term (current) drug therapy: Secondary | ICD-10-CM | POA: Insufficient documentation

## 2015-04-24 DIAGNOSIS — Z87891 Personal history of nicotine dependence: Secondary | ICD-10-CM | POA: Insufficient documentation

## 2015-04-24 DIAGNOSIS — R079 Chest pain, unspecified: Secondary | ICD-10-CM

## 2015-04-24 LAB — CBC
HEMATOCRIT: 48 % (ref 39.0–52.0)
Hemoglobin: 15.9 g/dL (ref 13.0–17.0)
MCH: 29.4 pg (ref 26.0–34.0)
MCHC: 33.1 g/dL (ref 30.0–36.0)
MCV: 88.7 fL (ref 78.0–100.0)
Platelets: 246 10*3/uL (ref 150–400)
RBC: 5.41 MIL/uL (ref 4.22–5.81)
RDW: 12.3 % (ref 11.5–15.5)
WBC: 6.4 10*3/uL (ref 4.0–10.5)

## 2015-04-24 LAB — BASIC METABOLIC PANEL
Anion gap: 7 (ref 5–15)
BUN: 17 mg/dL (ref 6–20)
CHLORIDE: 106 mmol/L (ref 101–111)
CO2: 27 mmol/L (ref 22–32)
Calcium: 9.2 mg/dL (ref 8.9–10.3)
Creatinine, Ser: 1.35 mg/dL — ABNORMAL HIGH (ref 0.61–1.24)
GFR calc Af Amer: >60 mL/min (ref >60)
Glucose, Bld: 77 mg/dL (ref 65–99)
Potassium: 4.5 mmol/L (ref 3.5–5.1)
Sodium: 140 mmol/L (ref 135–145)

## 2015-04-24 LAB — I-STAT TROPONIN, ED: Troponin i, poc: 0 ng/mL (ref 0.00–0.08)

## 2015-04-24 MED ORDER — IBUPROFEN 800 MG PO TABS
800.0000 mg | ORAL_TABLET | Freq: Once | ORAL | Status: AC
  Administered 2015-04-24: 800 mg via ORAL
  Filled 2015-04-24: qty 1

## 2015-04-24 MED ORDER — SODIUM CHLORIDE 0.9 % IV BOLUS (SEPSIS)
1000.0000 mL | Freq: Once | INTRAVENOUS | Status: AC
  Administered 2015-04-24: 1000 mL via INTRAVENOUS

## 2015-04-24 NOTE — ED Notes (Signed)
Pt alert, oriented, and ambulatory upon DC. He was advised to follow up with PCP and cardiology if pain persists. He leaves pain free. He verbalizes understanding to follow up with PCP regarding elevated creatinine.
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
## 2015-04-24 NOTE — ED Provider Notes (Signed)
CSN: [id number]     Arrival date & time 04/24/15  1844 History   First MD Initiated Contact with Patient 04/24/15 2054     Chief Complaint  Patient presents with  . Chest Pain    HPI   Mr. Wayne Carey is a 34 yo M PMH GERD pw midsternal CP. He describes the pain as transient, chronic (has been there his entire life), a pressure, non-radiating, 3/10 pain scale. He states the pain woke him up in middle of night last night. His pain is not related to exertion or eating. Burping releases pressure; however, Zegrid does not provide relief. No other alleviation attempts. He states he has been evaluated for this pain in the past, but the workup was negative. No fevers, chillls, SOB, palpitations, N/V/D, belly pain, back pain. Denies trauma.    Past Medical History  Diagnosis Date  . GERD (gastroesophageal reflux disease)    Past Surgical History  Procedure Laterality Date  . Testicle removal Right 1981   Family History  Problem Relation Age of Onset  . Diabetes Father   . Alcohol abuse Neg Hx   . Cancer Neg Hx   . COPD Neg Hx   . Depression Neg Hx   . Drug abuse Neg Hx   . Early death Neg Hx   . Hearing loss Neg Hx   . Heart disease Neg Hx   . Hyperlipidemia Neg Hx   . Hypertension Neg Hx   . Kidney disease Neg Hx   . Stroke Neg Hx    Social History  Substance Use Topics  . Smoking status: Former Games developermoker  . Smokeless tobacco: Never Used  . Alcohol Use: No    Review of Systems  Constitutional: Negative.   HENT: Negative.   Eyes: Negative.   Respiratory: Negative.   Cardiovascular: Positive for chest pain. Negative for palpitations and leg swelling.  Gastrointestinal: Negative.   Endocrine: Negative.   Genitourinary: Negative.   Musculoskeletal: Negative.   Skin: Negative.   Allergic/Immunologic: Negative.   Neurological: Negative.   Hematological: Negative.   Psychiatric/Behavioral: Negative.       Allergies  Review of patient's allergies indicates no known  allergies.  Home Medications   Prior to Admission medications   Medication Sig Start Date End Date Taking? Authorizing Provider  Icosapent Ethyl (VASCEPA) 1 G CAPS Take 2 capsules by mouth 2 (two) times daily. 08/03/14  Yes Etta Grandchildhomas L Jones, MD  omeprazole-sodium bicarbonate (ZEGERID) 40-1100 MG capsule Take 1 capsule by mouth daily before breakfast.   Yes Historical Provider, MD   BP 142/86 mmHg  Pulse 68  Temp(Src) 97.8 F (36.6 C) (Oral)  Resp 18  SpO2 97% Physical Exam  Constitutional: He appears well-developed and well-nourished. No distress.  HENT:  Head: Normocephalic and atraumatic.  Right Ear: External ear normal.  Left Ear: External ear normal.  Nose: Nose normal.  Mouth/Throat: Oropharynx is clear and moist. No oropharyngeal exudate.  Eyes: Conjunctivae are normal. Pupils are equal, round, and reactive to light. Right eye exhibits no discharge. Left eye exhibits no discharge. No scleral icterus.  Neck: No tracheal deviation present.  Cardiovascular: Normal rate, regular rhythm, normal heart sounds and intact distal pulses.  Exam reveals no gallop and no friction rub.   No murmur heard. Pulmonary/Chest: Effort normal and breath sounds normal. No respiratory distress. He has no wheezes. He has no rales. He exhibits tenderness.  Midsternal chest tenderness  Abdominal: Soft. Bowel sounds are normal. He exhibits no distension and no  mass. There is no tenderness. There is no rebound and no guarding.  Musculoskeletal: Normal range of motion. He exhibits no edema or tenderness.  Lymphadenopathy:    He has no cervical adenopathy.  Neurological: He is alert. Coordination normal.  Skin: Skin is warm and dry. No rash noted. He is not diaphoretic. No erythema.  Psychiatric: He has a normal mood and affect. His behavior is normal.  Nursing note and vitals reviewed.   ED Course  Procedures  Labs Review Labs Reviewed  BASIC METABOLIC PANEL - Abnormal; Notable for the following:     Creatinine, Ser 1.35 (*)    All other components within normal limits  CBC    Imaging Review Imaging Results       DG Chest 2 View (Final result) Result time: 04/24/15 21:25:53   Final result by Rad Results In Interface (04/24/15 21:25:53)   Narrative:   CLINICAL DATA: Mid chest pain for 2 weeks. Shielded patient.  EXAM: CHEST 2 VIEW  COMPARISON: None.  FINDINGS: The heart size and mediastinal contours are within normal limits. Both lungs are clear. The visualized skeletal structures are unremarkable.  IMPRESSION: No active cardiopulmonary disease.   Electronically Signed By: Richarda Overlie M.D. On: 04/24/2015 21:25   I have personally reviewed and evaluated these images and lab results as part of my medical decision-making.   EKG Interpretation   Date/Time:  Wednesday April 24 2015 18:53:39 EDT Ventricular Rate:  64 PR Interval:  120 QRS Duration: 80 QT Interval:  383 QTC Calculation: 395 R Axis:   68 Text Interpretation:  Sinus rhythm Probable left atrial enlargement  Baseline wander in lead(s) II III aVF No prior for comparison Confirmed by  Gwendolyn Grant  MD, BLAIR ([id number]) on 04/24/2015 9:23:43 PM      MDM   Final diagnoses:  Chest pain, unspecified chest pain type   Pt afebrile with normal VS. No tachycardia, no SOB, non-smoker, no recent vomiting, recent endoscopy, no ETOH abuse. Patient denies cocaine use. Given patient age, risk factors, less likely cardiac etiology; however, will order CBC, BMP, troponin, CXR, EKG. Most likely costochondritis. Less likely PE, esophageal rupture, tension pneumothorax, aortic dissection, cardiac tamponade, MI.   Will give ibuprofen for pain.  CBC, troponin, CXR, EKG all unremarkable. Creatinine slightly elevated. This seems to be baseline for him. Will give IVF.   Re-assessed patient, who feels much better. Advised follow-up with cardiologist within 2-3 days, as well as PCP visit for his chest pain and elevated  creatinine. Patient understands and in agreement.       Melton Krebs, PA-C 04/28/15 2214  Elwin Mocha, MD 04/29/15 [phone]

## 2015-04-24 NOTE — Discharge Instructions (Signed)
Wayne Carey,   Nice meeting you. Your creatinine was slightly elevated. Please followup with your primary care provider within one week regarding your hospital visit and elevated creatinine. If symptoms persist, please follow-up with a cardiologist within 2-3 days regarding your chest pain. Feel better soon!  S. Lane HackerNicole Latori Beggs, PA-C

## 2015-04-24 NOTE — ED Notes (Signed)
Pt ambulated to XRAY with steady gait 

## 2015-04-24 NOTE — Telephone Encounter (Signed)
DOB: 1980-07-26 Initial Comment Caller states he is having ongoing chest pains. Nurse Assessment Nurse: Nicanor Bakeosta, RN, Marylene LandAngela Date/Time (Eastern Time): 04/24/2015 3:27:30 PM Confirm and document reason for call. If symptomatic, describe symptoms. ---Caller states for the past 2 wks he has been having chest pain. The pain is constant on the left side of the chest. He takes OTC meds for reflux. No pain in neck or arm "but it will fall asleep easily". Has the patient traveled out of the country within the last 30 days? ---No Does the patient have any new or worsening symptoms? ---Yes Will a triage be completed? ---Yes Related visit to physician within the last 2 weeks? ---No Does the PT have any chronic conditions? (i.e. diabetes, asthma, etc.) ---Yes List chronic conditions. ---GERD/treats OTC Guidelines Guideline Title Affirmed Question Affirmed Notes Chest Pain [1] Chest pain lasts > 5 minutes AND [2] described as crushing, pressure-like, or heavy Final Disposition User Call EMS 911 Now [place], RN, Marylene Landngela Comments Unable to add record to Epic Disagree/Comply: Disagree Disagree/Comply Reason: Disagree with instructions

## 2015-06-04 ENCOUNTER — Encounter: Payer: Self-pay | Admitting: Internal Medicine

## 2015-06-04 ENCOUNTER — Ambulatory Visit (INDEPENDENT_AMBULATORY_CARE_PROVIDER_SITE_OTHER): Payer: BLUE CROSS/BLUE SHIELD | Admitting: Internal Medicine

## 2015-06-04 VITALS — BP 130/84 | HR 73 | Temp 98.2°F | Resp 16 | Ht 73.0 in | Wt 223.0 lb

## 2015-06-04 DIAGNOSIS — R9431 Abnormal electrocardiogram [ECG] [EKG]: Secondary | ICD-10-CM

## 2015-06-04 DIAGNOSIS — R079 Chest pain, unspecified: Secondary | ICD-10-CM | POA: Diagnosis not present

## 2015-06-04 NOTE — Patient Instructions (Signed)
Nonspecific Chest Pain  °Chest pain can be caused by many different conditions. There is always a chance that your pain could be related to something serious, such as a heart attack or a blood clot in your lungs. Chest pain can also be caused by conditions that are not life-threatening. If you have chest pain, it is very important to follow up with your health care provider. °CAUSES  °Chest pain can be caused by: °· Heartburn. °· Pneumonia or bronchitis. °· Anxiety or stress. °· Inflammation around your heart (pericarditis) or lung (pleuritis or pleurisy). °· A blood clot in your lung. °· A collapsed lung (pneumothorax). It can develop suddenly on its own (spontaneous pneumothorax) or from trauma to the chest. °· Shingles infection (varicella-zoster virus). °· Heart attack. °· Damage to the bones, muscles, and cartilage that make up your chest wall. This can include: °¨ Bruised bones due to injury. °¨ Strained muscles or cartilage due to frequent or repeated coughing or overwork. °¨ Fracture to one or more ribs. °¨ Sore cartilage due to inflammation (costochondritis). °RISK FACTORS  °Risk factors for chest pain may include: °· Activities that increase your risk for trauma or injury to your chest. °· Respiratory infections or conditions that cause frequent coughing. °· Medical conditions or overeating that can cause heartburn. °· Heart disease or family history of heart disease. °· Conditions or health behaviors that increase your risk of developing a blood clot. °· Having had chicken pox (varicella zoster). °SIGNS AND SYMPTOMS °Chest pain can feel like: °· Burning or tingling on the surface of your chest or deep in your chest. °· Crushing, pressure, aching, or squeezing pain. °· Dull or sharp pain that is worse when you move, cough, or take a deep breath. °· Pain that is also felt in your back, neck, shoulder, or arm, or pain that spreads to any of these areas. °Your chest pain may come and go, or it may stay  constant. °DIAGNOSIS °Lab tests or other studies may be needed to find the cause of your pain. Your health care provider may have you take a test called an ambulatory ECG (electrocardiogram). An ECG records your heartbeat patterns at the time the test is performed. You may also have other tests, such as: °· Transthoracic echocardiogram (TTE). During echocardiography, sound waves are used to create a picture of all of the heart structures and to look at how blood flows through your heart. °· Transesophageal echocardiogram (TEE). This is a more advanced imaging test that obtains images from inside your body. It allows your health care provider to see your heart in finer detail. °· Cardiac monitoring. This allows your health care provider to monitor your heart rate and rhythm in real time. °· Holter monitor. This is a portable device that records your heartbeat and can help to diagnose abnormal heartbeats. It allows your health care provider to track your heart activity for several days, if needed. °· Stress tests. These can be done through exercise or by taking medicine that makes your heart beat more quickly. °· Blood tests. °· Imaging tests. °TREATMENT  °Your treatment depends on what is causing your chest pain. Treatment may include: °· Medicines. These may include: °¨ Acid blockers for heartburn. °¨ Anti-inflammatory medicine. °¨ Pain medicine for inflammatory conditions. °¨ Antibiotic medicine, if an infection is present. °¨ Medicines to dissolve blood clots. °¨ Medicines to treat coronary artery disease. °· Supportive care for conditions that do not require medicines. This may include: °¨ Resting. °¨ Applying heat   or cold packs to injured areas. °¨ Limiting activities until pain decreases. °HOME CARE INSTRUCTIONS °· If you were prescribed an antibiotic medicine, finish it all even if you start to feel better. °· Avoid any activities that bring on chest pain. °· Do not use any tobacco products, including  cigarettes, chewing tobacco, or electronic cigarettes. If you need help quitting, ask your health care provider. °· Do not drink alcohol. °· Take medicines only as directed by your health care provider. °· Keep all follow-up visits as directed by your health care provider. This is important. This includes any further testing if your chest pain does not go away. °· If heartburn is the cause for your chest pain, you may be told to keep your head raised (elevated) while sleeping. This reduces the chance that acid will go from your stomach into your esophagus. °· Make lifestyle changes as directed by your health care provider. These may include: °¨ Getting regular exercise. Ask your health care provider to suggest some activities that are safe for you. °¨ Eating a heart-healthy diet. A registered dietitian can help you to learn healthy eating options. °¨ Maintaining a healthy weight. °¨ Managing diabetes, if necessary. °¨ Reducing stress. °SEEK MEDICAL CARE IF: °· Your chest pain does not go away after treatment. °· You have a rash with blisters on your chest. °· You have a fever. °SEEK IMMEDIATE MEDICAL CARE IF:  °· Your chest pain is worse. °· You have an increasing cough, or you cough up blood. °· You have severe abdominal pain. °· You have severe weakness. °· You faint. °· You have chills. °· You have sudden, unexplained chest discomfort. °· You have sudden, unexplained discomfort in your arms, back, neck, or jaw. °· You have shortness of breath at any time. °· You suddenly start to sweat, or your skin gets clammy. °· You feel nauseous or you vomit. °· You suddenly feel light-headed or dizzy. °· Your heart begins to beat quickly, or it feels like it is skipping beats. °These symptoms may represent a serious problem that is an emergency. Do not wait to see if the symptoms will go away. Get medical help right away. Call your local emergency services (911 in the [place] Do not drive yourself to the hospital. °  °This  information is not intended to replace advice given to you by your health care provider. Make sure you discuss any questions you have with your health care provider. °  °Document Released: 03/25/2005 Document Revised: 07/06/2014 Document Reviewed: 01/19/2014 °Elsevier Interactive Patient Education ©2016 [employer] ° °

## 2015-06-04 NOTE — Progress Notes (Signed)
Pre visit review using our clinic review tool, if applicable. No additional management support is needed unless otherwise documented below in the visit note. 

## 2015-06-05 NOTE — Progress Notes (Signed)
Subjective:  Patient ID: Wayne Carey, male    DOB: Jul 24, 1980  Age: 34 y.o. MRN: [id number]  CC: Chest Pain   HPI Wayne OraMichael Corliss presents for f/up after a recent ER visit for CP - he has had intermittent episodes of CP for 18 years. It flares from time to time with no known cause or precipitating factors. He describes the pain as a feeling that his heart is being squeezed and there is an aching sensation with movement. He was told by the ER provider to see a cardiologist and he asks that I refer him to cardiology because he is concerned that there is something wrong with his heart.  Outpatient Prescriptions Prior to Visit  Medication Sig Dispense Refill  . Icosapent Ethyl (VASCEPA) 1 G CAPS Take 2 capsules by mouth 2 (two) times daily. 120 capsule 11  . omeprazole-sodium bicarbonate (ZEGERID) 40-1100 MG capsule Take 1 capsule by mouth daily before breakfast.     No facility-administered medications prior to visit.    ROS Review of Systems  Constitutional: Negative.  Negative for fever, chills, diaphoresis, appetite change and fatigue.  HENT: Negative.  Negative for trouble swallowing and voice change.   Eyes: Negative.   Respiratory: Positive for cough. Negative for apnea, choking, chest tightness, shortness of breath, wheezing and stridor.   Cardiovascular: Negative.  Negative for chest pain, palpitations and leg swelling.  Gastrointestinal: Negative.  Negative for nausea, vomiting, abdominal pain, diarrhea, constipation and blood in stool.  Genitourinary: Negative.   Musculoskeletal: Negative.   Skin: Negative.   Neurological: Negative.  Negative for dizziness, syncope, speech difficulty, weakness, light-headedness, numbness and headaches.  Hematological: Negative.  Negative for adenopathy. Does not bruise/bleed easily.  Psychiatric/Behavioral: Negative.  Negative for suicidal ideas, dysphoric mood and decreased concentration. The patient is not nervous/anxious.     Objective:    BP 130/84 mmHg  Pulse 73  Temp(Src) 98.2 F (36.8 C) (Oral)  Resp 16  Ht 6\' 1"  (1.854 m)  Wt 223 lb (101.152 kg)  BMI 29.43 kg/m2  SpO2 98%  BP Readings from Last 3 Encounters:  06/04/15 130/84  04/24/15 122/64  08/31/14 126/90    Wt Readings from Last 3 Encounters:  06/04/15 223 lb (101.152 kg)  08/31/14 226 lb 8 oz (102.74 kg)  08/03/14 226 lb 8 oz (102.74 kg)    Physical Exam  Constitutional: He is oriented to person, place, and time. No distress.  HENT:  Mouth/Throat: No oropharyngeal exudate.  Eyes: Right eye exhibits no discharge. Left eye exhibits no discharge. No scleral icterus.  Neck: Normal range of motion. Neck supple. No JVD present. No tracheal deviation present. No thyromegaly present.  Cardiovascular: Normal rate, regular rhythm, normal heart sounds and intact distal pulses.  Exam reveals no gallop and no friction rub.   No murmur heard. EKG - NSR with short PR There are no Q waves and no ST/T wave changes and no LVH  Pulmonary/Chest: Effort normal and breath sounds normal. No stridor. No respiratory distress. He has no wheezes. He has no rales. He exhibits no tenderness.  Abdominal: Soft. Bowel sounds are normal. He exhibits no distension and no mass. There is no tenderness. There is no rebound and no guarding.  Musculoskeletal: Normal range of motion. He exhibits no edema or tenderness.  Lymphadenopathy:    He has no cervical adenopathy.  Neurological: He is oriented to person, place, and time.  Skin: Skin is warm and dry. No rash noted. He is not diaphoretic. No  erythema. No pallor.  Psychiatric: He has a normal mood and affect. His behavior is normal. Judgment and thought content normal.  Vitals reviewed.   Lab Results  Component Value Date   WBC 6.4 04/24/2015   HGB 15.9 04/24/2015   HCT 48.0 04/24/2015   PLT 246 04/24/2015   GLUCOSE 77 04/24/2015   CHOL 136 08/03/2014   TRIG 317.0* 08/03/2014   HDL 24.60* 08/03/2014   LDLDIRECT 74.0  08/03/2014   ALT 53 08/03/2014   AST 27 08/03/2014   NA 140 04/24/2015   K 4.5 04/24/2015   CL 106 04/24/2015   CREATININE 1.35* 04/24/2015   BUN 17 04/24/2015   CO2 27 04/24/2015   TSH 2.46 12/05/2012    Dg Chest 2 View  04/24/2015  CLINICAL DATA:  Mid chest pain for 2 weeks.  Shielded patient. EXAM: CHEST  2 VIEW COMPARISON:  None. FINDINGS: The heart size and mediastinal contours are within normal limits. Both lungs are clear. The visualized skeletal structures are unremarkable. IMPRESSION: No active cardiopulmonary disease. Electronically Signed   By: Richarda Overlie M.D.   On: 04/24/2015 21:25    Assessment & Plan:   Audric was seen today for chest pain.  Diagnoses and all orders for this visit:  Chest pain, unspecified chest pain type- his CP is atypical and I am not concerned about ischemia, his pain sounds like anxiety and MS pain to me, his EKG is normal and unchanged, Will refer to cardiology at his request -     EKG 12-Lead -     Ambulatory referral to Cardiology  Nonspecific abnormal electrocardiogram (ECG) (EKG)- will refer to cardiology at his request -     Ambulatory referral to Cardiology   I am having Mr. Bartmess maintain his Icosapent Ethyl and omeprazole-sodium bicarbonate.  No orders of the defined types were placed in this encounter.     Follow-up: Return in about 3 months (around 09/02/2015).  Sanda Linger, MD

## 2015-06-06 ENCOUNTER — Ambulatory Visit: Payer: BLUE CROSS/BLUE SHIELD | Admitting: Cardiology

## 2015-06-14 ENCOUNTER — Ambulatory Visit (INDEPENDENT_AMBULATORY_CARE_PROVIDER_SITE_OTHER): Payer: BLUE CROSS/BLUE SHIELD | Admitting: Cardiovascular Disease

## 2015-06-14 ENCOUNTER — Encounter: Payer: Self-pay | Admitting: Cardiovascular Disease

## 2015-06-14 VITALS — BP 112/68 | HR 77 | Ht 74.0 in | Wt 225.4 lb

## 2015-06-14 DIAGNOSIS — R0789 Other chest pain: Secondary | ICD-10-CM | POA: Diagnosis not present

## 2015-06-14 NOTE — Assessment & Plan Note (Signed)
Mr. Wayne Carey was referred by his primary care physician, Dr. Sanda Lingerhomas Jones, for evaluation of atypical chest pain. He basically has no cardiac risk factors. The pain occurred 6 weeks ago, occurred every day for several days and then resolved. He is currently pain-free. His EKG shows no acute changes. I'm going to get a routine GXT to risk stratify him and if negative will see him back as needed.

## 2015-06-14 NOTE — Patient Instructions (Signed)
Medication Instructions:  Your physician recommends that you continue on your current medications as directed. Please refer to the Current Medication list given to you today.  Labwork: NONE  Testing/Procedures: Your physician has requested that you have an exercise tolerance test. For further information please visit [email or web address]. Please also follow instruction sheet, as given.  Follow-Up: We request that you follow-up as needed with Dr. Allyson SabalBerry.         If you need a refill on your cardiac medications before your next appointment, please call your pharmacy. '

## 2015-06-14 NOTE — Progress Notes (Signed)
     06/14/2015 Wayne Carey   Dec 25, 1980  [id number]  Primary Physician Sanda Lingerhomas Jones, MD Primary Cardiologist: Runell GessJonathan J. Ghassan Coggeshall MD Roseanne RenoFACP,FACC,FAHA, FSCAI   HPI:  Wayne Carey is a 34 year old mildly overweight married Caucasian male father of one 428-year-old son who apparently was born with a VSD. He is referred by his primary care physician, Dr. Sanda Lingerhomas Jones, for cardiovascular evaluation because of atypical chest pain. He works in [employer]. He is very active and plays best couple times a week. He has no cardiac risk factors. He had atypical chest discomfort approximately 6 weeks ago that occurred on a daily basis for several days but has been asymptomatic recently.   Current Outpatient Prescriptions  Medication Sig Dispense Refill  . Icosapent Ethyl (VASCEPA) 1 G CAPS Take 2 capsules by mouth 2 (two) times daily. 120 capsule 11  . omeprazole-sodium bicarbonate (ZEGERID) 40-1100 MG capsule Take 1 capsule by mouth daily before breakfast.     No current facility-administered medications for this visit.    No Known Allergies  Social History   Social History  . Marital Status: Married    Spouse Name: N/A  . Number of Children: N/A  . Years of Education: N/A   Occupational History  . Not on file.   Social History Main Topics  . Smoking status: Former Games developermoker  . Smokeless tobacco: Never Used  . Alcohol Use: No  . Drug Use: No  . Sexual Activity: Yes   Other Topics Concern  . Not on file   Social History Narrative     Review of Systems: General: negative for chills, fever, night sweats or weight changes.  Cardiovascular: negative for chest pain, dyspnea on exertion, edema, orthopnea, palpitations, paroxysmal nocturnal dyspnea or shortness of breath Dermatological: negative for rash Respiratory: negative for cough or wheezing Urologic: negative for hematuria Abdominal: negative for nausea, vomiting, diarrhea, bright red blood per rectum, melena, or  hematemesis Neurologic: negative for visual changes, syncope, or dizziness All other systems reviewed and are otherwise negative except as noted above.    Blood pressure 112/68, pulse 77, height 6\' 2"  (1.88 m), weight 225 lb 7 oz (102.258 kg).  General appearance: alert and no distress Neck: no adenopathy, no carotid bruit, no JVD, supple, symmetrical, trachea midline and thyroid not enlarged, symmetric, no tenderness/mass/nodules Lungs: clear to auscultation bilaterally Heart: regular rate and rhythm, S1, S2 normal, no murmur, click, rub or gallop Extremities: extremities normal, atraumatic, no cyanosis or edema  EKG normal sinus rhythm of 77 without ST or T-wave changes. I personally reviewed this EKG  ASSESSMENT AND PLAN:   Atypical chest pain Wayne Carey was referred by his primary care physician, Dr. Sanda Lingerhomas Jones, for evaluation of atypical chest pain. He basically has no cardiac risk factors. The pain occurred 6 weeks ago, occurred every day for several days and then resolved. He is currently pain-free. His EKG shows no acute changes. I'm going to get a routine GXT to risk stratify him and if negative will see him back as needed.      Runell GessJonathan J. Alayzha An MD FACP,FACC,FAHA, [facility]SC[facility] 06/14/2015 12:21 PM

## 2015-07-12 ENCOUNTER — Telehealth (HOSPITAL_COMMUNITY): Payer: Self-pay

## 2015-07-12 NOTE — Telephone Encounter (Signed)
Encounter complete. 

## 2015-07-17 ENCOUNTER — Ambulatory Visit (HOSPITAL_COMMUNITY)
Admission: RE | Admit: 2015-07-17 | Discharge: 2015-07-17 | Disposition: A | Discharge status: Home / Self Care | Payer: BLUE CROSS/BLUE SHIELD | Source: Ambulatory Visit | Attending: Urology | Admitting: Urology

## 2015-07-17 DIAGNOSIS — R0789 Other chest pain: Secondary | ICD-10-CM | POA: Diagnosis present

## 2015-07-17 LAB — EXERCISE TOLERANCE TEST
CHL CUP RESTING HR STRESS: 71 {beats}/min
CSEPHR: 92 %
Estimated workload: 13.5 METS
Exercise duration (min): 12 min
MPHR: 184 {beats}/min
Peak HR: 171 {beats}/min
RPE: 16

## 2017-09-15 DIAGNOSIS — Z118 Encounter for screening for other infectious and parasitic diseases: Secondary | ICD-10-CM | POA: Diagnosis not present

## 2017-09-15 DIAGNOSIS — Z113 Encounter for screening for infections with a predominantly sexual mode of transmission: Secondary | ICD-10-CM | POA: Diagnosis not present

## 2017-09-15 DIAGNOSIS — Z114 Encounter for screening for human immunodeficiency virus [HIV]: Secondary | ICD-10-CM | POA: Diagnosis not present

## 2017-09-15 DIAGNOSIS — N500 Atrophy of testis: Secondary | ICD-10-CM | POA: Diagnosis not present

## 2017-09-30 DIAGNOSIS — F4325 Adjustment disorder with mixed disturbance of emotions and conduct: Secondary | ICD-10-CM | POA: Diagnosis not present

## 2017-10-15 DIAGNOSIS — F4325 Adjustment disorder with mixed disturbance of emotions and conduct: Secondary | ICD-10-CM | POA: Diagnosis not present

## 2017-10-25 DIAGNOSIS — F4325 Adjustment disorder with mixed disturbance of emotions and conduct: Secondary | ICD-10-CM | POA: Diagnosis not present

## 2019-01-23 ENCOUNTER — Other Ambulatory Visit: Payer: Self-pay

## 2019-01-23 ENCOUNTER — Encounter: Payer: Self-pay | Admitting: Family Medicine

## 2019-01-23 ENCOUNTER — Ambulatory Visit (INDEPENDENT_AMBULATORY_CARE_PROVIDER_SITE_OTHER): Payer: BC Managed Care – PPO | Admitting: Family Medicine

## 2019-01-23 DIAGNOSIS — M545 Low back pain, unspecified: Secondary | ICD-10-CM

## 2019-01-23 DIAGNOSIS — Z7689 Persons encountering health services in other specified circumstances: Secondary | ICD-10-CM

## 2019-01-23 DIAGNOSIS — Z8719 Personal history of other diseases of the digestive system: Secondary | ICD-10-CM

## 2019-01-23 DIAGNOSIS — R109 Unspecified abdominal pain: Secondary | ICD-10-CM | POA: Diagnosis not present

## 2019-01-23 NOTE — Progress Notes (Signed)
Virtual Visit via Video Note  I connected with Wayne Carey on 01/23/19 at 11:00 AM EDT by a video enabled telemedicine application and verified that I am speaking with the correct person using two identifiers.  Location patient: home Location provider:work or home office Persons participating in the virtual visit: patient, provider  I discussed the limitations of evaluation and management by telemedicine and the availability of in person appointments. The patient expressed understanding and agreed to proceed.   HPI: Pt is a 38 yo with pmh sig for h/o fatty liver and kidney stones.  Pt previously seen by Dr. Scarlette Calico at [facility] several yrs ago.  Pt states he is healthy and not taking any meds.  Back pain -x 2 wks -"feels like someone is grabbing my kidney" -feeling happened more at night/first thing in am,  now becoming more frequent -feels like the pain moves  In middle of back to right/ R flank -denies sharp pain, dysuria, frequency, N/V, fever, chills -has a h/o of kidney stone -taking ibuprofen but feeling is not really a pain. -drinking more water now  Allergies: NKDA  Pt working in [place], [place].  ROS: See pertinent positives and negatives per HPI.  Past Medical History:  Diagnosis Date  . Atypical chest pain   . GERD (gastroesophageal reflux disease)     Past Surgical History:  Procedure Laterality Date  . TESTICLE REMOVAL Right 1981    Family History  Problem Relation Age of Onset  . Diabetes Father   . Alcohol abuse Neg Hx   . Cancer Neg Hx   . COPD Neg Hx   . Depression Neg Hx   . Drug abuse Neg Hx   . Early death Neg Hx   . Hearing loss Neg Hx   . Heart disease Neg Hx   . Hyperlipidemia Neg Hx   . Hypertension Neg Hx   . Kidney disease Neg Hx   . Stroke Neg Hx      Current Outpatient Medications:  .  Icosapent Ethyl (VASCEPA) 1 G CAPS, Take 2 capsules by mouth 2 (two) times daily., Disp: 120 capsule, Rfl: 11 .  omeprazole-sodium bicarbonate  (ZEGERID) 40-1100 MG capsule, Take 1 capsule by mouth daily before breakfast., Disp: , Rfl:   EXAM:  VITALS per patient if applicable: RR between 95-18 bpm  GENERAL: alert, oriented, appears well and in no acute distress  HEENT: atraumatic, conjunctiva clear, no obvious abnormalities on inspection of external nose and ears  NECK: normal movements of the head and neck  LUNGS: on inspection no signs of respiratory distress, breathing rate appears normal, no obvious gross SOB, gasping or wheezing  CV: no obvious cyanosis  MS: moves all visible extremities without noticeable abnormality  PSYCH/NEURO: pleasant and cooperative, no obvious depression or anxiety, speech and thought processing grossly intact  ASSESSMENT AND PLAN:  Discussed the following assessment and plan:  Acute right flank pain  -concern for renal calculi, UTI, pyelonephritis -pt to have labs.  States unable to come to lab today but will have done tomorrow. -given precautions - Plan: Comprehensive metabolic panel, CBC with Differential/Platelet, Urinalysis with Reflex Microscopic  Acute right-sided low back pain without sciatica  -supportive care. Heat, massage, NSAIDs prn - Plan: Comprehensive metabolic panel  History of fatty infiltration of liver  - Plan: Comprehensive metabolic panel  Encounter to establish care  -We reviewed the PMH, PSH, FH, SH, Meds and Allergies. -We provided refills for any medications we will prescribe as needed. -We addressed  current concerns per orders and patient instructions. -We have asked for records for pertinent exams, studies, vaccines and notes from previous providers. -We have advised patient to follow up per instructions below.  F/u prn   I discussed the assessment and treatment plan with the patient. The patient was provided an opportunity to ask questions and all were answered. The patient agreed with the plan and demonstrated an understanding of the instructions.    The patient was advised to call back or seek an in-person evaluation if the symptoms worsen or if the condition fails to improve as anticipated.  Deeann SaintShannon R Jaunita Mikels, MD

## 2019-01-24 ENCOUNTER — Other Ambulatory Visit (INDEPENDENT_AMBULATORY_CARE_PROVIDER_SITE_OTHER): Payer: BC Managed Care – PPO

## 2019-01-24 ENCOUNTER — Other Ambulatory Visit: Payer: Self-pay

## 2019-01-24 DIAGNOSIS — R109 Unspecified abdominal pain: Secondary | ICD-10-CM | POA: Diagnosis not present

## 2019-01-24 DIAGNOSIS — Z8719 Personal history of other diseases of the digestive system: Secondary | ICD-10-CM

## 2019-01-24 DIAGNOSIS — M545 Low back pain, unspecified: Secondary | ICD-10-CM

## 2019-01-25 LAB — COMPREHENSIVE METABOLIC PANEL
ALT: 61 U/L — ABNORMAL HIGH (ref 0–53)
AST: 33 U/L (ref 0–37)
Albumin: 4.6 g/dL (ref 3.5–5.2)
Alkaline Phosphatase: 69 U/L (ref 39–117)
BUN: 19 mg/dL (ref 6–23)
CO2: 29 mEq/L (ref 19–32)
Calcium: 9.8 mg/dL (ref 8.4–10.5)
Chloride: 102 mEq/L (ref 96–112)
Creatinine, Ser: 1.65 mg/dL — ABNORMAL HIGH (ref 0.40–1.50)
GFR: 46.36 mL/min — ABNORMAL LOW (ref >60.00)
Glucose, Bld: 93 mg/dL (ref 70–99)
Potassium: 4.1 mEq/L (ref 3.5–5.1)
Sodium: 140 mEq/L (ref 135–145)
Total Bilirubin: 0.5 mg/dL (ref 0.2–1.2)
Total Protein: 7.5 g/dL (ref 6.0–8.3)

## 2019-01-25 LAB — CBC WITH DIFFERENTIAL/PLATELET
Basophils Absolute: 0 10*3/uL (ref 0.0–0.1)
Basophils Relative: 0.4 % (ref 0.0–3.0)
Eosinophils Absolute: 0 10*3/uL (ref 0.0–0.7)
Eosinophils Relative: 0.7 % (ref 0.0–5.0)
HCT: 48.1 % (ref 39.0–52.0)
Hemoglobin: 16.2 g/dL (ref 13.0–17.0)
Lymphocytes Relative: 32.5 % (ref 12.0–46.0)
Lymphs Abs: 2.1 10*3/uL (ref 0.7–4.0)
MCHC: 33.7 g/dL (ref 30.0–36.0)
MCV: 88.4 fl (ref 78.0–100.0)
Monocytes Absolute: 0.5 10*3/uL (ref 0.1–1.0)
Monocytes Relative: 7.8 % (ref 3.0–12.0)
Neutro Abs: 3.8 10*3/uL (ref 1.4–7.7)
Neutrophils Relative %: 58.6 % (ref 43.0–77.0)
Platelets: 224 10*3/uL (ref 150.0–400.0)
RBC: 5.44 Mil/uL (ref 4.22–5.81)
RDW: 12.9 % (ref 11.5–15.5)
WBC: 6.5 10*3/uL (ref 4.0–10.5)

## 2019-01-25 LAB — URINALYSIS, ROUTINE W REFLEX MICROSCOPIC
Bilirubin Urine: NEGATIVE
Hgb urine dipstick: NEGATIVE
Ketones, ur: NEGATIVE
Leukocytes,Ua: NEGATIVE
Nitrite: NEGATIVE
RBC / HPF: NONE SEEN (ref 0–?)
Specific Gravity, Urine: 1.02 (ref 1.000–1.030)
Total Protein, Urine: NEGATIVE
Urine Glucose: NEGATIVE
Urobilinogen, UA: 1 (ref 0.0–1.0)
pH: 7 (ref 5.0–8.0)

## 2019-02-02 ENCOUNTER — Other Ambulatory Visit: Payer: Self-pay | Admitting: *Deleted

## 2019-02-02 ENCOUNTER — Encounter: Payer: Self-pay | Admitting: Family Medicine

## 2019-02-02 DIAGNOSIS — R109 Unspecified abdominal pain: Secondary | ICD-10-CM

## 2019-02-14 ENCOUNTER — Ambulatory Visit
Admission: RE | Admit: 2019-02-14 | Discharge: 2019-02-14 | Disposition: A | Discharge status: Home / Self Care | Payer: BC Managed Care – PPO | Source: Ambulatory Visit | Attending: Family Medicine | Admitting: Family Medicine

## 2019-02-14 DIAGNOSIS — R740 Nonspecific elevation of levels of transaminase and lactic acid dehydrogenase [LDH]: Secondary | ICD-10-CM | POA: Diagnosis not present

## 2019-02-14 DIAGNOSIS — R109 Unspecified abdominal pain: Secondary | ICD-10-CM

## 2019-02-20 ENCOUNTER — Encounter: Payer: Self-pay | Admitting: Family Medicine

## 2019-02-27 NOTE — Telephone Encounter (Signed)
Spoke with pt states that the pain he had has gone away, no further issues

## 2019-02-28 NOTE — Telephone Encounter (Signed)
Pt given his lab results and advised to have a US of the kidney if willing

## 2019-09-28 ENCOUNTER — Emergency Department (HOSPITAL_COMMUNITY): Payer: BC Managed Care – PPO

## 2019-09-28 ENCOUNTER — Other Ambulatory Visit: Payer: Self-pay

## 2019-09-28 ENCOUNTER — Emergency Department (HOSPITAL_COMMUNITY)
Admission: EM | Admit: 2019-09-28 | Discharge: 2019-09-29 | Disposition: A | Discharge status: Home / Self Care | Payer: BC Managed Care – PPO | Attending: Emergency Medicine | Admitting: Emergency Medicine

## 2019-09-28 ENCOUNTER — Encounter (HOSPITAL_COMMUNITY): Payer: Self-pay

## 2019-09-28 DIAGNOSIS — Z87891 Personal history of nicotine dependence: Secondary | ICD-10-CM | POA: Diagnosis not present

## 2019-09-28 DIAGNOSIS — Z79899 Other long term (current) drug therapy: Secondary | ICD-10-CM | POA: Insufficient documentation

## 2019-09-28 DIAGNOSIS — R109 Unspecified abdominal pain: Secondary | ICD-10-CM | POA: Diagnosis not present

## 2019-09-28 DIAGNOSIS — N200 Calculus of kidney: Secondary | ICD-10-CM | POA: Insufficient documentation

## 2019-09-28 LAB — URINALYSIS, ROUTINE W REFLEX MICROSCOPIC
Bacteria, UA: NONE SEEN
Bilirubin Urine: NEGATIVE
Glucose, UA: 50 mg/dL — AB
Ketones, ur: NEGATIVE mg/dL
Leukocytes,Ua: NEGATIVE
Nitrite: NEGATIVE
Protein, ur: NEGATIVE mg/dL
Specific Gravity, Urine: 1.014 (ref 1.005–1.030)
pH: 6 (ref 5.0–8.0)

## 2019-09-28 MED ORDER — OXYCODONE-ACETAMINOPHEN 5-325 MG PO TABS
1.0000 | ORAL_TABLET | ORAL | Status: AC | PRN
  Administered 2019-09-28 (×2): 1 via ORAL
  Filled 2019-09-28 (×2): qty 1

## 2019-09-28 MED ORDER — SODIUM CHLORIDE 0.9 % IV BOLUS
1000.0000 mL | Freq: Once | INTRAVENOUS | Status: AC
  Administered 2019-09-28: 1000 mL via INTRAVENOUS

## 2019-09-28 MED ORDER — MORPHINE SULFATE (PF) 4 MG/ML IV SOLN
8.0000 mg | Freq: Once | INTRAVENOUS | Status: AC
  Administered 2019-09-28: 8 mg via INTRAVENOUS
  Filled 2019-09-28: qty 2

## 2019-09-28 MED ORDER — KETOROLAC TROMETHAMINE 15 MG/ML IJ SOLN
15.0000 mg | Freq: Once | INTRAMUSCULAR | Status: AC
  Administered 2019-09-28: 15 mg via INTRAVENOUS
  Filled 2019-09-28: qty 1

## 2019-09-28 MED ORDER — ONDANSETRON HCL 4 MG/2ML IJ SOLN
4.0000 mg | Freq: Once | INTRAMUSCULAR | Status: AC
  Administered 2019-09-28: 4 mg via INTRAVENOUS
  Filled 2019-09-28: qty 2

## 2019-09-28 NOTE — ED Triage Notes (Signed)
Patient arrived with complaints of right sided flank pain that started roughly an hour ago. States he has a kidney stone in the past but it has been 15 years ago. Denies any urinary symptoms.

## 2019-09-28 NOTE — ED Provider Notes (Signed)
[facility] DEPT Provider Note   CSN: [id number] Arrival date & time: 09/28/19  1922     History Chief Complaint  Patient presents with  . Flank Pain    Wayne Carey is a 39 y.o. male.  39 yo M with a chief complaint of right flank pain.  Going on just since this afternoon.  Pain has been pretty constant but does seem to come and go at times.  Gotten so severe that it made him vomit.  Denies fevers denies urinary symptoms.  Has a remote history of kidney stones and thinks this feels similar.  Has never required an intervention for his stone.  Denies trauma.  Denies worsening with movement or palpation.  The history is provided by the patient.  Flank Pain This is a recurrent problem. The current episode started 6 to 12 hours ago. The problem occurs constantly. The problem has not changed since onset.Pertinent negatives include no chest pain, no abdominal pain, no headaches and no shortness of breath. Nothing aggravates the symptoms. Nothing relieves the symptoms. He has tried nothing for the symptoms. The treatment provided no relief.       Past Medical History:  Diagnosis Date  . Atypical chest pain   . GERD (gastroesophageal reflux disease)     Patient Active Problem List   Diagnosis Date Noted  . Atypical chest pain 06/14/2015  . Pain in the chest 06/04/2015  . Nonspecific abnormal electrocardiogram (ECG) (EKG) 06/04/2015  . Hypertriglyceridemia, essential 08/03/2014  . Fatty liver disease, nonalcoholic 62/08/5595  . 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 Routine general medical examination at a health care facility 12/05/2012  . GERD 04/03/2010    Past Surgical History:  Procedure Laterality Date  . TESTICLE REMOVAL Right 1981       Family History  Problem Relation Age of Onset  . Diabetes Father   . Alcohol abuse Neg Hx   . Cancer Neg Hx   . COPD Neg Hx   . Depression Neg Hx   . Drug abuse Neg Hx   . Early death Neg Hx   .  Hearing loss Neg Hx   . Heart disease Neg Hx   . Hyperlipidemia Neg Hx   . Hypertension Neg Hx   . Kidney disease Neg Hx   . Stroke Neg Hx     Social History   Tobacco Use  . Smoking status: Former Research scientist (life sciences)  . Smokeless tobacco: Never Used  Substance Use Topics  . Alcohol use: No    Alcohol/week: 0.0 standard drinks  . Drug use: No    Home Medications Prior to Admission medications   Medication Sig Start Date End Date Taking? Authorizing Provider  Icosapent Ethyl (VASCEPA) 1 G CAPS Take 2 capsules by mouth 2 (two) times daily. Patient not taking: Reported on 09/28/2019 08/03/14   Janith Lima, MD  morphine (MSIR) 15 MG tablet Take 0.5 tablets (7.5 mg total) by mouth every 4 (four) hours as needed for severe pain. 09/29/19   Deno Etienne, DO  ondansetron (ZOFRAN ODT) 4 MG disintegrating tablet 4mg  ODT q4 hours prn nausea/vomit 09/29/19   Deno Etienne, DO  tamsulosin (FLOMAX) 0.4 MG CAPS capsule Take 1 capsule (0.4 mg total) by mouth daily after supper. 09/29/19   Deno Etienne, DO    Allergies    Patient has no known allergies.  Review of Systems   Review of Systems  Constitutional: Negative for chills and fever.  HENT: Negative for congestion and facial  swelling.   Eyes: Negative for discharge and visual disturbance.  Respiratory: Negative for shortness of breath.   Cardiovascular: Negative for chest pain and palpitations.  Gastrointestinal: Negative for abdominal pain, diarrhea and vomiting.  Genitourinary: Positive for flank pain.  Musculoskeletal: Negative for arthralgias and myalgias.  Skin: Negative for color change and rash.  Neurological: Negative for tremors, syncope and headaches.  Psychiatric/Behavioral: Negative for confusion and dysphoric mood.    Physical Exam Updated Vital Signs BP (!) 147/87 (BP Location: Left Arm)   Pulse 76   Temp 97.7 F (36.5 C) (Oral)   Resp 18   SpO2 97%   Physical Exam Vitals and nursing note reviewed.  Constitutional:      Appearance:  He is well-developed.  HENT:     Head: Normocephalic and atraumatic.  Eyes:     Pupils: Pupils are equal, round, and reactive to light.  Neck:     Vascular: No JVD.  Cardiovascular:     Rate and Rhythm: Normal rate and regular rhythm.     Heart sounds: No murmur. No friction rub. No gallop.   Pulmonary:     Effort: No respiratory distress.     Breath sounds: No wheezing.  Abdominal:     General: There is no distension.     Tenderness: There is no abdominal tenderness. There is no guarding or rebound.  Musculoskeletal:        General: Normal range of motion.     Cervical back: Normal range of motion and neck supple.  Skin:    Coloration: Skin is not pale.     Findings: No rash.  Neurological:     Mental Status: He is alert and oriented to person, place, and time.  Psychiatric:        Behavior: Behavior normal.     ED Results / Procedures / Treatments   Labs (all labs ordered are listed, but only abnormal results are displayed) Labs Reviewed  URINALYSIS, ROUTINE W REFLEX MICROSCOPIC - Abnormal; Notable for the following components:      Result Value   Color, Urine STRAW (*)    Glucose, UA 50 (*)    Hgb urine dipstick SMALL (*)    All other components within normal limits    EKG None  Radiology US Renal  Result Date: 09/29/2019 CLINICAL DATA:  Right-sided flank pain EXAM: RENAL / URINARY TRACT ULTRASOUND COMPLETE COMPARISON:  07/25/2014 FINDINGS: Right Kidney: Renal measurements: 11.2 x 6.0 x 5.3 cm. = volume: 356 mL . Echogenicity within normal limits. No mass or hydronephrosis visualized. Left Kidney: Renal measurements: 10.9 x 4.5 x 5.4 cm. = volume: 139 mL. Echogenicity within normal limits. No mass or hydronephrosis visualized. Bladder: Appears normal for degree of bladder distention. Other: None. IMPRESSION: No acute abnormality noted. Electronically Signed   By: Alcide Clever M.D.   On: 09/29/2019 00:17    Procedures Procedures (including critical care  time)  Medications Ordered in ED Medications  oxyCODONE-acetaminophen (PERCOCET/ROXICET) 5-325 MG per tablet 1 tablet (1 tablet Oral Given 09/28/19 2024)  sodium chloride 0.9 % bolus 1,000 mL (0 mLs Intravenous Stopped 09/28/19 2212)  morphine 4 MG/ML injection 8 mg (8 mg Intravenous Given 09/28/19 2038)  ondansetron (ZOFRAN) injection 4 mg (4 mg Intravenous Given 09/28/19 2036)  ketorolac (TORADOL) 15 MG/ML injection 15 mg (15 mg Intravenous Given 09/28/19 2037)    ED Course  I have reviewed the triage vital signs and the nursing notes.  Pertinent labs & imaging results that were  available during my care of the patient were reviewed by me and considered in my medical decision making (see chart for details).    MDM Rules/Calculators/A&P                      39 yo M with a chief complaints of right flank pain.  Patient has a remote history of kidney sepsis this feels the same.  He certainly if looks like he has a kidney stone on exam.  No physical exam never is able to repeat the patient's pain.  We will treat his pain here.  Has he has had no interventions previously will obtain a renal ultrasound.  Patient does have blood in his urine.  Had significant improvements with symptomatic therapy.  Ultrasound without significant hydronephrosis or visualization of the stone.  We will have the patient follow-up with urology.  12:27 AM:  I have discussed the diagnosis/risks/treatment options with the patient and believe the pt to be eligible for discharge home to follow-up with PCP, urology. We also discussed returning to the ED immediately if new or worsening sx occur. We discussed the sx which are most concerning (e.g., sudden worsening pain, fever, inability to tolerate by mouth) that necessitate immediate return. Medications administered to the patient during their visit and any new prescriptions provided to the patient are listed below.  Medications given during this visit Medications   oxyCODONE-acetaminophen (PERCOCET/ROXICET) 5-325 MG per tablet 1 tablet (1 tablet Oral Given 09/28/19 2024)  sodium chloride 0.9 % bolus 1,000 mL (0 mLs Intravenous Stopped 09/28/19 2212)  morphine 4 MG/ML injection 8 mg (8 mg Intravenous Given 09/28/19 2038)  ondansetron (ZOFRAN) injection 4 mg (4 mg Intravenous Given 09/28/19 2036)  ketorolac (TORADOL) 15 MG/ML injection 15 mg (15 mg Intravenous Given 09/28/19 2037)     The patient appears reasonably screen and/or stabilized for discharge and I doubt any other medical condition or other [facility] requiring further screening, evaluation, or treatment in the ED at this time prior to discharge.   Final Clinical Impression(s) / ED Diagnoses Final diagnoses:  Nephrolithiasis    Rx / DC Orders ED Discharge Orders         Ordered    morphine (MSIR) 15 MG tablet  Every 4 hours PRN     09/29/19 0025    ondansetron (ZOFRAN ODT) 4 MG disintegrating tablet     09/29/19 0025    tamsulosin (FLOMAX) 0.4 MG CAPS capsule  Daily after supper     09/29/19 Jeremy Johann, DO 09/29/19 [phone]

## 2019-09-28 NOTE — ED Notes (Signed)
Dr. Floyd at bedside. 

## 2019-09-29 DIAGNOSIS — R109 Unspecified abdominal pain: Secondary | ICD-10-CM | POA: Diagnosis not present

## 2019-09-29 MED ORDER — ONDANSETRON 4 MG PO TBDP: ORAL_TABLET | ORAL | 0 refills | Status: AC

## 2019-09-29 MED ORDER — MORPHINE SULFATE 15 MG PO TABS: 7.5000 mg | ORAL_TABLET | ORAL | 0 refills | Status: AC | PRN

## 2019-09-29 MED ORDER — TAMSULOSIN HCL 0.4 MG PO CAPS: 0.4000 mg | ORAL_CAPSULE | Freq: Every day | ORAL | 0 refills | Status: AC

## 2019-09-29 NOTE — Discharge Instructions (Signed)

## 2019-09-29 NOTE — ED Notes (Signed)
Pt ambulatory on discharge.

## 2020-04-16 DIAGNOSIS — D225 Melanocytic nevi of trunk: Secondary | ICD-10-CM | POA: Diagnosis not present

## 2020-04-16 DIAGNOSIS — D2262 Melanocytic nevi of left upper limb, including shoulder: Secondary | ICD-10-CM | POA: Diagnosis not present

## 2020-04-16 DIAGNOSIS — L821 Other seborrheic keratosis: Secondary | ICD-10-CM | POA: Diagnosis not present

## 2020-04-16 DIAGNOSIS — D1801 Hemangioma of skin and subcutaneous tissue: Secondary | ICD-10-CM | POA: Diagnosis not present

## 2021-10-08 ENCOUNTER — Other Ambulatory Visit: Payer: Self-pay

## 2021-10-08 ENCOUNTER — Emergency Department (HOSPITAL_BASED_OUTPATIENT_CLINIC_OR_DEPARTMENT_OTHER): Payer: BC Managed Care – PPO

## 2021-10-08 ENCOUNTER — Encounter (HOSPITAL_BASED_OUTPATIENT_CLINIC_OR_DEPARTMENT_OTHER): Payer: Self-pay | Admitting: Urology

## 2021-10-08 ENCOUNTER — Emergency Department (HOSPITAL_BASED_OUTPATIENT_CLINIC_OR_DEPARTMENT_OTHER)
Admission: EM | Admit: 2021-10-08 | Discharge: 2021-10-08 | Disposition: A | Discharge status: Home / Self Care | Payer: BC Managed Care – PPO | Attending: Emergency Medicine | Admitting: Emergency Medicine

## 2021-10-08 DIAGNOSIS — R109 Unspecified abdominal pain: Secondary | ICD-10-CM | POA: Insufficient documentation

## 2021-10-08 DIAGNOSIS — N201 Calculus of ureter: Secondary | ICD-10-CM

## 2021-10-08 LAB — COMPREHENSIVE METABOLIC PANEL
ALT: 93 U/L — ABNORMAL HIGH (ref 0–44)
AST: 43 U/L — ABNORMAL HIGH (ref 15–41)
Albumin: 4.8 g/dL (ref 3.5–5.0)
Alkaline Phosphatase: 60 U/L (ref 38–126)
Anion gap: 9 (ref 5–15)
BUN: 22 mg/dL — ABNORMAL HIGH (ref 6–20)
CO2: 26 mmol/L (ref 22–32)
Calcium: 10 mg/dL (ref 8.9–10.3)
Chloride: 105 mmol/L (ref 98–111)
Creatinine, Ser: 1.73 mg/dL — ABNORMAL HIGH (ref 0.61–1.24)
GFR, Estimated: 50 mL/min — ABNORMAL LOW (ref >60)
Glucose, Bld: 105 mg/dL — ABNORMAL HIGH (ref 70–99)
Potassium: 4 mmol/L (ref 3.5–5.1)
Sodium: 140 mmol/L (ref 135–145)
Total Bilirubin: 0.7 mg/dL (ref 0.3–1.2)
Total Protein: 8 g/dL (ref 6.5–8.1)

## 2021-10-08 LAB — URINALYSIS, MICROSCOPIC (REFLEX): Bacteria, UA: NONE SEEN

## 2021-10-08 LAB — CBC
HCT: 46.1 % (ref 39.0–52.0)
Hemoglobin: 15.7 g/dL (ref 13.0–17.0)
MCH: 30.1 pg (ref 26.0–34.0)
MCHC: 34.1 g/dL (ref 30.0–36.0)
MCV: 88.5 fL (ref 80.0–100.0)
Platelets: 221 10*3/uL (ref 150–400)
RBC: 5.21 MIL/uL (ref 4.22–5.81)
RDW: 11.9 % (ref 11.5–15.5)
WBC: 8.3 10*3/uL (ref 4.0–10.5)
nRBC: 0 % (ref 0.0–0.2)

## 2021-10-08 LAB — URINALYSIS, ROUTINE W REFLEX MICROSCOPIC
Bilirubin Urine: NEGATIVE
Glucose, UA: 100 mg/dL — AB
Ketones, ur: NEGATIVE mg/dL
Leukocytes,Ua: NEGATIVE
Nitrite: NEGATIVE
Protein, ur: NEGATIVE mg/dL
Specific Gravity, Urine: 1.02 (ref 1.005–1.030)
pH: 7 (ref 5.0–8.0)

## 2021-10-08 LAB — LIPASE, BLOOD: Lipase: 25 U/L (ref 11–51)

## 2021-10-08 MED ORDER — ONDANSETRON HCL 4 MG/2ML IJ SOLN
4.0000 mg | Freq: Once | INTRAMUSCULAR | Status: AC
Start: 2021-10-08 — End: 2021-10-08
  Administered 2021-10-08: 4 mg via INTRAVENOUS
  Filled 2021-10-08: qty 2

## 2021-10-08 MED ORDER — ONDANSETRON 4 MG PO TBDP: 4.0000 mg | ORAL_TABLET | Freq: Three times a day (TID) | ORAL | 1 refills | Status: AC | PRN

## 2021-10-08 MED ORDER — HYDROCODONE-ACETAMINOPHEN 5-325 MG PO TABS: 1.0000 | ORAL_TABLET | Freq: Four times a day (QID) | ORAL | 0 refills | Status: AC | PRN

## 2021-10-08 MED ORDER — FENTANYL CITRATE PF 50 MCG/ML IJ SOSY
50.0000 ug | PREFILLED_SYRINGE | INTRAMUSCULAR | Status: AC | PRN
  Administered 2021-10-08 (×2): 50 ug via INTRAVENOUS
  Filled 2021-10-08 (×2): qty 1

## 2021-10-08 MED ORDER — HYDROMORPHONE HCL 1 MG/ML IJ SOLN
1.0000 mg | Freq: Once | INTRAMUSCULAR | Status: AC
Start: 2021-10-08 — End: 2021-10-08
  Administered 2021-10-08: 1 mg via INTRAVENOUS
  Filled 2021-10-08: qty 1

## 2021-10-08 NOTE — ED Triage Notes (Signed)
Left sided flank pain that started 4 hours ago ?State vomit x 1 prior to coming ?H/o kidney stone  ?Denies urinary issues ? ? ?

## 2021-10-08 NOTE — ED Notes (Signed)
Patient Pain Level has increased from 4/10 to 9/10. Standing Orders placed by this RN until further EDP Evaluation. ?

## 2021-10-08 NOTE — Discharge Instructions (Signed)
CT shows a 3 mm stone in the distal part of the left ureter very close to the bladder.  Most likely will be able to pass this.  Take the pain medication as directed.  Take the antinausea medicine as directed.  Make an appointment to follow-up with urology.  Work note provided. ?

## 2021-10-08 NOTE — ED Notes (Signed)
Patient on Edge of Bed as Pain has returned to more severe level. MD made aware.  ?

## 2021-10-08 NOTE — ED Notes (Signed)
Informed Lil - RN of pt's BP. Pt stated that the pain meds are not working. ?

## 2021-10-08 NOTE — ED Provider Notes (Addendum)
?[facility] EMERGENC[facility] DEPT ?Provider Note ? ? ?CSN: [id number] ?Arrival date & time: 10/08/21  1145 ? ?  ? ?History ? ?Chief Complaint  ?Patient presents with  ? Flank Pain  ? ? ?Wayne Carey is a 41 y.o. male. ? ?Patient with a history of kidney stones.  Acute onset of left flank pain at about 730 this morning associated with some nausea and vomiting.  Patient last had pain in April 1 seen at [facility] with ultrasound study showing no ureteral stones. ? ?Patient's history is significant for gastroesophageal reflux disease and atypical chest pain. ? ? ?  ? ?Home Medications ?Prior to Admission medications   ?Medication Sig Start Date End Date Taking? Authorizing Provider  ?Icosapent Ethyl (VASCEPA) 1 G CAPS Take 2 capsules by mouth 2 (two) times daily. ?Patient not taking: Reported on 09/28/2019 08/03/14   Etta Grandchild, MD  ?morphine (MSIR) 15 MG tablet Take 0.5 tablets (7.5 mg total) by mouth every 4 (four) hours as needed for severe pain. 09/29/19   Melene Plan, DO  ?ondansetron (ZOFRAN ODT) 4 MG disintegrating tablet 4mg  ODT q4 hours prn nausea/vomit 09/29/19   11/29/19, DO  ?tamsulosin (FLOMAX) 0.4 MG CAPS capsule Take 1 capsule (0.4 mg total) by mouth daily after supper. 09/29/19   11/29/19, DO  ?   ? ?Allergies    ?Patient has no known allergies.   ? ?Review of Systems   ?Review of Systems  ?Constitutional:  Negative for chills and fever.  ?HENT:  Negative for ear pain and sore throat.   ?Eyes:  Negative for pain and visual disturbance.  ?Respiratory:  Negative for cough and shortness of breath.   ?Cardiovascular:  Negative for chest pain and palpitations.  ?Gastrointestinal:  Positive for nausea and vomiting. Negative for abdominal pain.  ?Genitourinary:  Positive for flank pain. Negative for dysuria and hematuria.  ?Musculoskeletal:  Negative for arthralgias and back pain.  ?Skin:  Negative for color change and rash.  ?Neurological:  Negative for seizures and syncope.  ?All other systems reviewed  and are negative. ? ?Physical Exam ?Updated Vital Signs ?BP (!) 163/121   Pulse 72   Temp 97.9 ?F (36.6 ?C)   Resp 18   Ht 1.88 m (6\' 2" )   Wt 108.9 kg   SpO2 97%   BMI 30.81 kg/m?  ?Physical Exam ?Vitals and nursing note reviewed.  ?Constitutional:   ?   General: He is not in acute distress. ?   Appearance: Normal appearance. He is well-developed.  ?HENT:  ?   Head: Normocephalic and atraumatic.  ?Eyes:  ?   Conjunctiva/sclera: Conjunctivae normal.  ?Cardiovascular:  ?   Rate and Rhythm: Normal rate and regular rhythm.  ?   Heart sounds: No murmur heard. ?Pulmonary:  ?   Effort: Pulmonary effort is normal. No respiratory distress.  ?   Breath sounds: Normal breath sounds.  ?Abdominal:  ?   Palpations: Abdomen is soft.  ?   Tenderness: There is no abdominal tenderness. There is no guarding.  ?Musculoskeletal:     ?   General: No swelling.  ?   Cervical back: Neck supple.  ?Skin: ?   General: Skin is warm and dry.  ?   Capillary Refill: Capillary refill takes less than 2 seconds.  ?Neurological:  ?   General: No focal deficit present.  ?   Mental Status: He is alert and oriented to person, place, and time.  ?Psychiatric:     ?  Mood and Affect: Mood normal.  ? ? ?ED Results / Procedures / Treatments   ?Labs ?(all labs ordered are listed, but only abnormal results are displayed) ?Labs Reviewed  ?URINALYSIS, ROUTINE W REFLEX MICROSCOPIC - Abnormal; Notable for the following components:  ?    Result Value  ? Glucose, UA 100 (*)   ? Hgb urine dipstick MODERATE (*)   ? All other components within normal limits  ?COMPREHENSIVE METABOLIC PANEL - Abnormal; Notable for the following components:  ? Glucose, Bld 105 (*)   ? BUN 22 (*)   ? Creatinine, Ser 1.73 (*)   ? AST 43 (*)   ? ALT 93 (*)   ? GFR, Estimated 50 (*)   ? All other components within normal limits  ?CBC  ?URINALYSIS, MICROSCOPIC (REFLEX)  ?LIPASE, BLOOD  ? ? ?EKG ?None ? ?Radiology ?No results found. ? ?Procedures ?Procedures  ? ? ?Medications Ordered in  ED ?Medications  ?HYDROmorphone (DILAUDID) injection 1 mg (has no administration in time range)  ?ondansetron (ZOFRAN) injection 4 mg (has no administration in time range)  ?fentaNYL (SUBLIMAZE) injection 50 mcg (50 mcg Intravenous Given 10/08/21 1405)  ? ? ?ED Course/ Medical Decision Making/ A&P ?  ?                        ?Medical Decision Making ?Amount and/or Complexity of Data Reviewed ?Labs: ordered. ?Radiology: ordered. ? ?Risk ?Prescription drug management. ? ?We will get CT renal study to evaluate for stone.  Patient's work-up by ultrasound was negative on April 1.  Certainly could have formed a stone between now and then.  Patient's renal function not significantly off from normal at 1.73 GFR 50 liver function test normal other than mild elevation in AST ALT lipase was normal white count normal hemoglobin normal urinalysis significant for moderate hemoglobin and did have 11-20 red blood cells.  No bacteria. ? ?Patient treated with fentanyl here x2 days that does not work very well for him to give him a dose of hydromorphone.  Will get CT renal study.  Disposition will be based on the CT renal study.  Patient will require follow-up with urology.  If he has a ureteral stone. ? ?CT scans shows a 3 mm stone at the distal ureter at the junction of the bladder.  Patient with some mild hydronephrosis.  But nothing significant.  We will treat patient with pain medicine.  And follow-up with urology. ? ? ?Final Clinical Impression(s) / ED Diagnoses ?Final diagnoses:  ?Flank pain  ? ? ?Rx / DC Orders ?ED Discharge Orders   ? ? None  ? ?  ? ? ?  ?[name], MD ?10/08/21 1436 ? ?  ?[name], MD ?10/08/21 1437 ? ?  ?[name], MD ?10/08/21 1515 ? ?

## 2021-10-08 NOTE — ED Notes (Signed)
RN provided AVS using Teachback Method. Patient verbalizes understanding of Discharge Instructions. Opportunity for Questioning and Answers were provided by RN. Patient Discharged from ED ambulatory to Home with Family. ? ?

## 2023-08-22 IMAGING — CT CT RENAL STONE PROTOCOL
2 of 4 series · 16 of 46 positions shown, 18 images · non-contrast
Comparison: July 25, 2014.

CLINICAL DATA: Acute left flank pain.



[Series 2: stone full · axial · 0.80mm/px · z∈[-326,+154]mm · 13 of 106 slices shown, 15 images]
[im 5/106  soft-tissue]
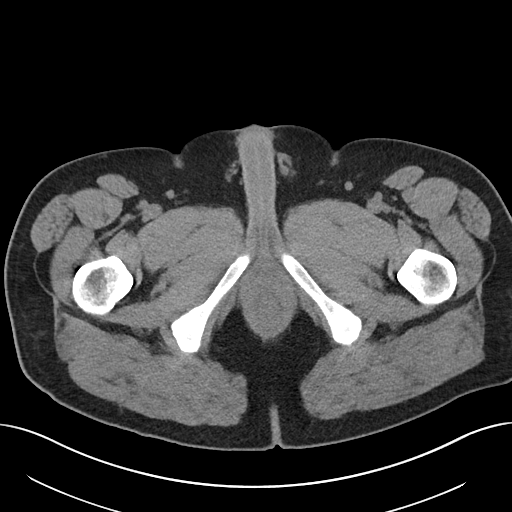
[im 5/106  bone]
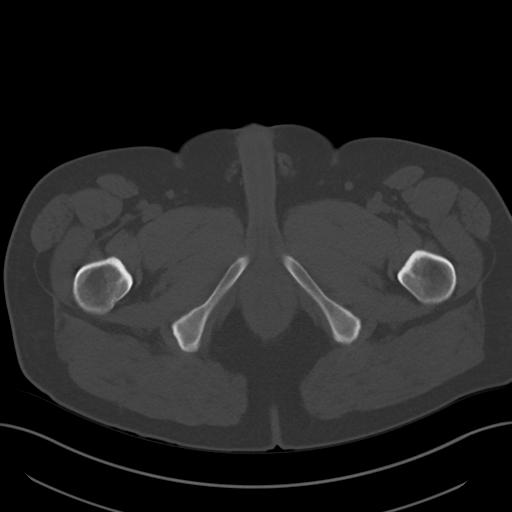
[im 13/106  soft-tissue]
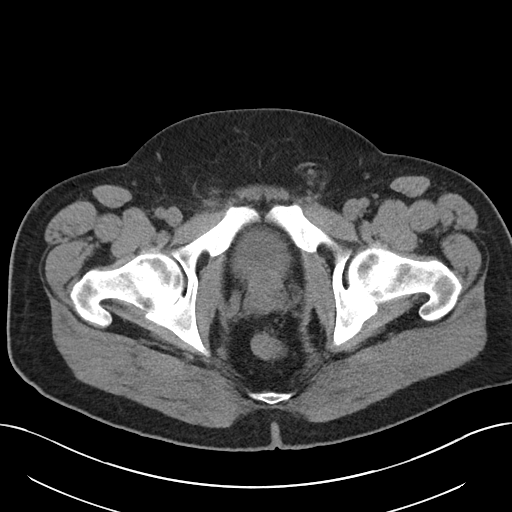
[im 22/106  soft-tissue]
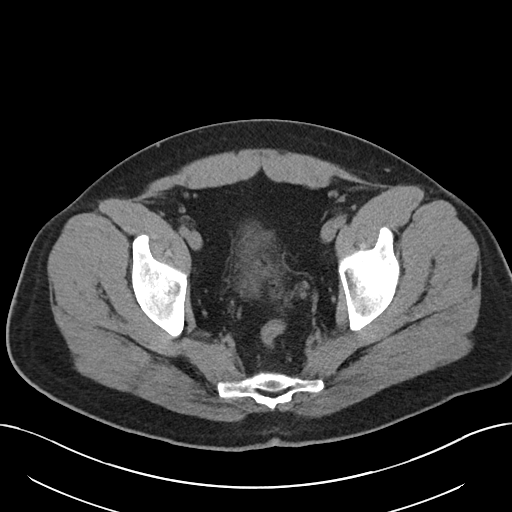
[im 30/106  soft-tissue]
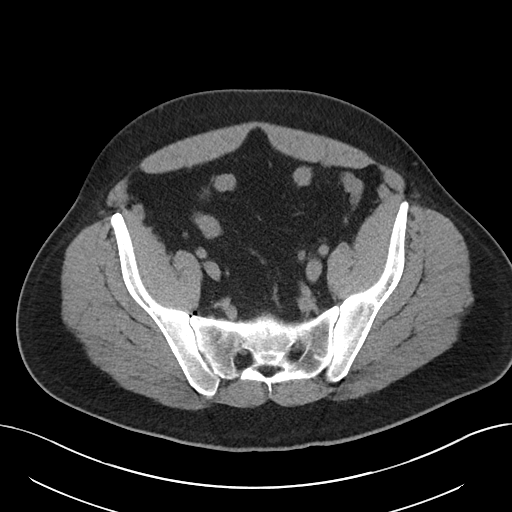
[im 38/106  soft-tissue]
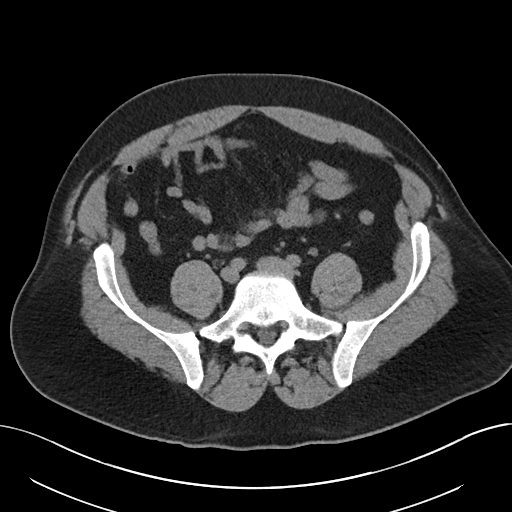
[im 47/106  soft-tissue]
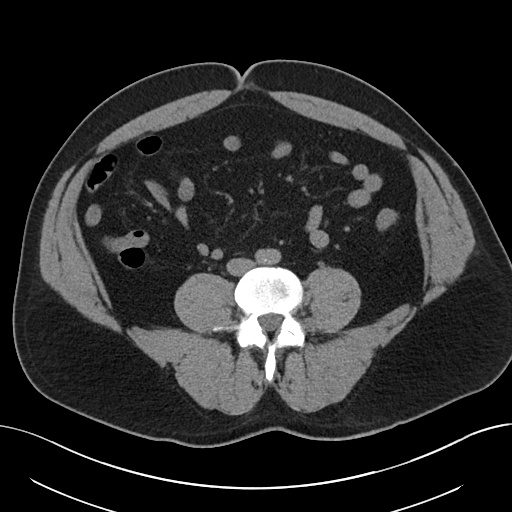
[im 55/106  soft-tissue]
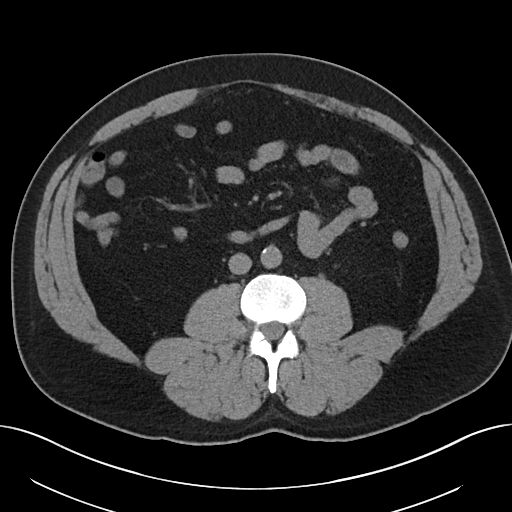
[im 59/106  soft-tissue]
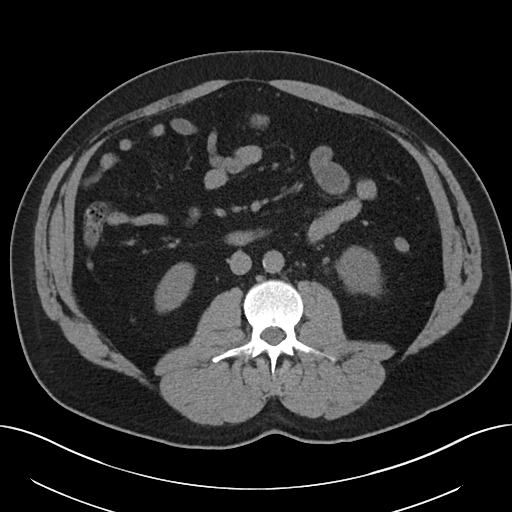
[im 68/106  soft-tissue]
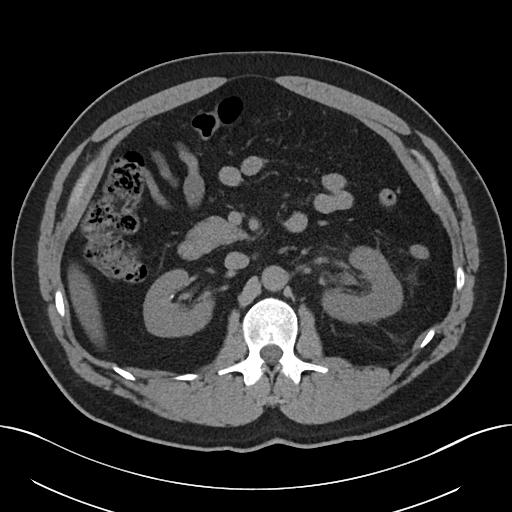
[im 68/106  bone]
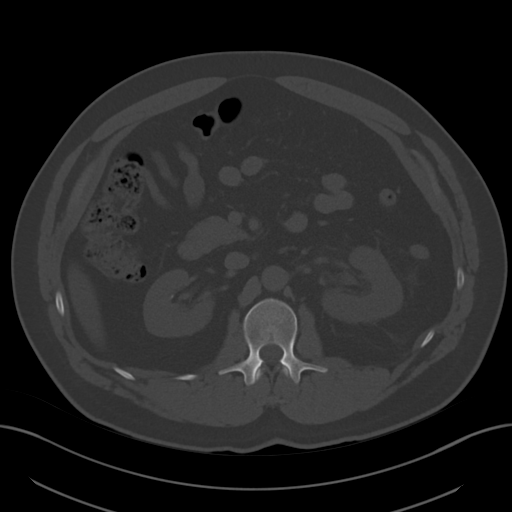
[im 76/106  soft-tissue]
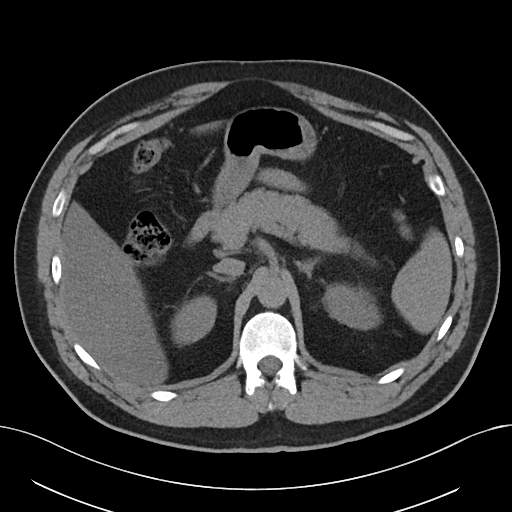
[im 85/106  soft-tissue]
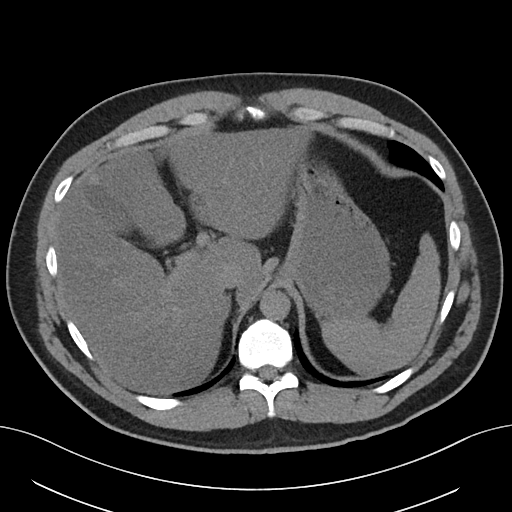
[im 93/106  soft-tissue]
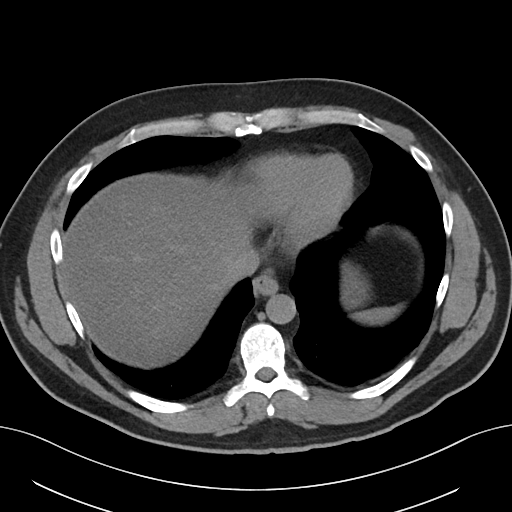
[im 101/106  soft-tissue]
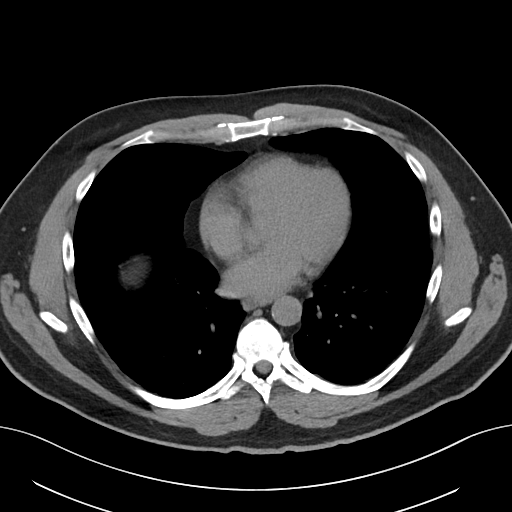

[Series 5: coronal · coronal · 0.88mm/px · 3 of 109 slices shown]
[im 37/109  soft-tissue]
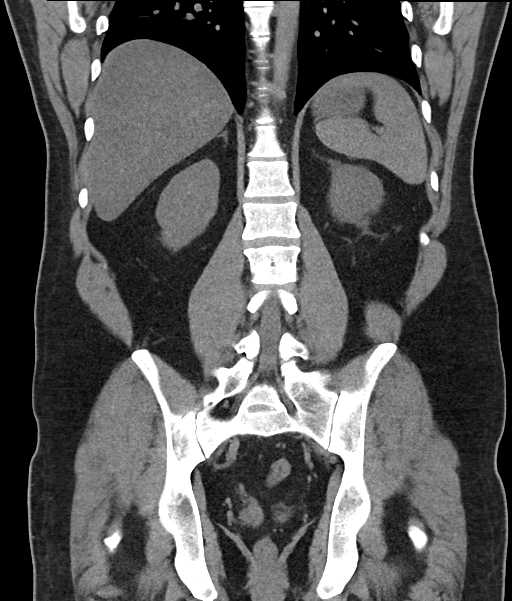
[im 49/109  soft-tissue]
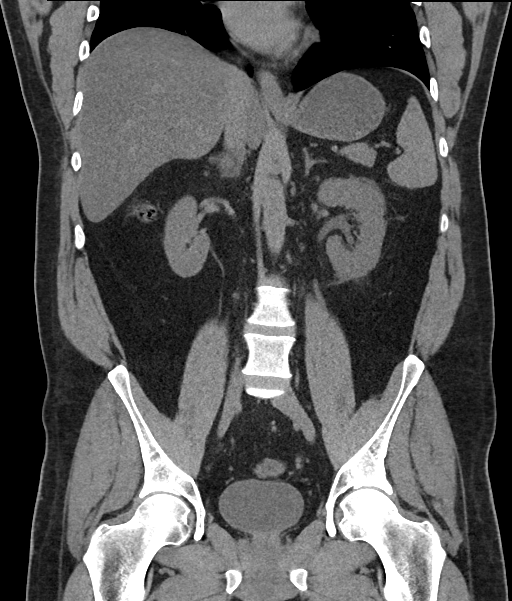
[im 61/109  soft-tissue]
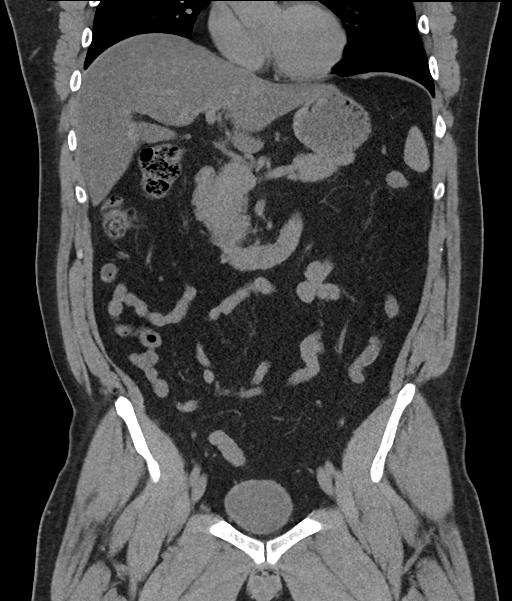

[16 of 46 positions shown; findings below may reference images not displayed]

FINDINGS: Lower chest: No acute abnormality.

Hepatobiliary: No gallstones or biliary dilatation is noted. Hepatic
steatosis is noted.

Pancreas: Unremarkable. No pancreatic ductal dilatation or
surrounding inflammatory changes.

Spleen: Normal in size without focal abnormality.

Adrenals/Urinary Tract: Adrenal glands appear normal. Bilateral
nephrolithiasis is noted. Mild left hydroureteronephrosis is noted
secondary to 3 mm calculus at the left ureterovesical junction.
Urinary bladder is unremarkable.

Stomach/Bowel: Stomach is within normal limits. Appendix appears
normal. No evidence of bowel wall thickening, distention, or
inflammatory changes.

Vascular/Lymphatic: Aortic atherosclerosis. No enlarged abdominal or
pelvic lymph nodes.

Reproductive: Prostate is unremarkable.

Other: No abdominal wall hernia or abnormality. No abdominopelvic
ascites.

Musculoskeletal: No acute or significant osseous findings.
IMPRESSION: Bilateral nephrolithiasis. Mild left hydroureteronephrosis is noted
secondary to 3 mm calculus at the left ureterovesical junction.

Hepatic steatosis.

Aortic Atherosclerosis ([id number]).
